# Patient Record
Sex: Female | Born: 1950 | Race: White | Hispanic: No | Marital: Married | Smoking: Never smoker
Health system: Southern US, Community
[De-identification: ages and names within clinical notes are randomized; demographics above are authoritative.]

## PROBLEM LIST (undated history)

## (undated) DIAGNOSIS — F0390 Unspecified dementia without behavioral disturbance: Secondary | ICD-10-CM

## (undated) DIAGNOSIS — I10 Essential (primary) hypertension: Secondary | ICD-10-CM

## (undated) HISTORY — PX: HYSTEROTOMY: SHX1776

---

## 2004-06-26 ENCOUNTER — Ambulatory Visit: Payer: Self-pay | Admitting: Family Medicine

## 2005-08-09 ENCOUNTER — Ambulatory Visit: Payer: Self-pay | Admitting: Family Medicine

## 2006-09-18 ENCOUNTER — Ambulatory Visit: Payer: Self-pay | Admitting: Family Medicine

## 2006-09-24 ENCOUNTER — Ambulatory Visit: Payer: Self-pay | Admitting: Family Medicine

## 2006-12-25 ENCOUNTER — Ambulatory Visit: Payer: Self-pay | Admitting: Unknown Physician Specialty

## 2007-09-18 ENCOUNTER — Ambulatory Visit: Payer: Self-pay | Admitting: Family Medicine

## 2007-10-06 ENCOUNTER — Ambulatory Visit: Payer: Self-pay | Admitting: Family Medicine

## 2008-02-16 ENCOUNTER — Ambulatory Visit: Payer: Self-pay | Admitting: Unknown Physician Specialty

## 2008-02-19 ENCOUNTER — Observation Stay: Payer: Self-pay | Admitting: Unknown Physician Specialty

## 2008-05-31 ENCOUNTER — Ambulatory Visit: Payer: Self-pay | Admitting: Unknown Physician Specialty

## 2008-06-03 ENCOUNTER — Inpatient Hospital Stay: Payer: Self-pay | Admitting: Unknown Physician Specialty

## 2008-06-22 ENCOUNTER — Ambulatory Visit: Payer: Self-pay | Admitting: Urology

## 2008-07-08 ENCOUNTER — Inpatient Hospital Stay: Payer: Self-pay | Admitting: Unknown Physician Specialty

## 2008-10-25 ENCOUNTER — Ambulatory Visit: Payer: Self-pay | Admitting: Family Medicine

## 2009-01-12 ENCOUNTER — Ambulatory Visit: Payer: Self-pay | Admitting: Family Medicine

## 2009-09-14 ENCOUNTER — Ambulatory Visit: Payer: Self-pay | Admitting: Unknown Physician Specialty

## 2009-10-15 ENCOUNTER — Emergency Department: Payer: Self-pay | Admitting: Emergency Medicine

## 2009-10-26 ENCOUNTER — Ambulatory Visit: Payer: Self-pay | Admitting: Family Medicine

## 2010-02-27 ENCOUNTER — Ambulatory Visit: Payer: Self-pay | Admitting: Unknown Physician Specialty

## 2011-01-10 ENCOUNTER — Ambulatory Visit: Payer: Self-pay | Admitting: Family Medicine

## 2011-02-14 ENCOUNTER — Emergency Department: Payer: Self-pay | Admitting: Unknown Physician Specialty

## 2012-02-07 ENCOUNTER — Ambulatory Visit: Payer: Self-pay | Admitting: Family Medicine

## 2012-02-18 ENCOUNTER — Ambulatory Visit: Payer: Self-pay | Admitting: Family Medicine

## 2012-08-18 ENCOUNTER — Ambulatory Visit: Payer: Self-pay | Admitting: Family Medicine

## 2015-06-07 DIAGNOSIS — N3941 Urge incontinence: Secondary | ICD-10-CM | POA: Diagnosis not present

## 2015-07-20 DIAGNOSIS — Z23 Encounter for immunization: Secondary | ICD-10-CM | POA: Diagnosis not present

## 2015-07-20 DIAGNOSIS — G301 Alzheimer's disease with late onset: Secondary | ICD-10-CM | POA: Diagnosis not present

## 2015-07-20 DIAGNOSIS — I10 Essential (primary) hypertension: Secondary | ICD-10-CM | POA: Diagnosis not present

## 2015-07-20 DIAGNOSIS — E785 Hyperlipidemia, unspecified: Secondary | ICD-10-CM | POA: Diagnosis not present

## 2015-07-20 DIAGNOSIS — F0281 Dementia in other diseases classified elsewhere with behavioral disturbance: Secondary | ICD-10-CM | POA: Diagnosis not present

## 2015-08-03 DIAGNOSIS — H43813 Vitreous degeneration, bilateral: Secondary | ICD-10-CM | POA: Diagnosis not present

## 2015-08-03 DIAGNOSIS — H35033 Hypertensive retinopathy, bilateral: Secondary | ICD-10-CM | POA: Diagnosis not present

## 2015-08-03 DIAGNOSIS — H2510 Age-related nuclear cataract, unspecified eye: Secondary | ICD-10-CM | POA: Diagnosis not present

## 2015-08-24 DIAGNOSIS — F0281 Dementia in other diseases classified elsewhere with behavioral disturbance: Secondary | ICD-10-CM | POA: Diagnosis not present

## 2015-08-24 DIAGNOSIS — G301 Alzheimer's disease with late onset: Secondary | ICD-10-CM | POA: Diagnosis not present

## 2015-10-21 ENCOUNTER — Emergency Department
Admission: EM | Admit: 2015-10-21 | Discharge: 2015-10-21 | Disposition: A | Discharge status: Home / Self Care | Payer: PPO | Attending: Emergency Medicine | Admitting: Emergency Medicine

## 2015-10-21 ENCOUNTER — Encounter: Payer: Self-pay | Admitting: Emergency Medicine

## 2015-10-21 DIAGNOSIS — I10 Essential (primary) hypertension: Secondary | ICD-10-CM | POA: Diagnosis not present

## 2015-10-21 DIAGNOSIS — R55 Syncope and collapse: Secondary | ICD-10-CM | POA: Diagnosis not present

## 2015-10-21 DIAGNOSIS — Z79899 Other long term (current) drug therapy: Secondary | ICD-10-CM | POA: Diagnosis not present

## 2015-10-21 HISTORY — DX: Unspecified dementia, unspecified severity, without behavioral disturbance, psychotic disturbance, mood disturbance, and anxiety: F03.90

## 2015-10-21 HISTORY — DX: Essential (primary) hypertension: I10

## 2015-10-21 LAB — COMPREHENSIVE METABOLIC PANEL
ALBUMIN: 4.2 g/dL (ref 3.5–5.0)
ALT: 25 U/L (ref 14–54)
AST: 31 U/L (ref 15–41)
Alkaline Phosphatase: 77 U/L (ref 38–126)
Anion gap: 9 (ref 5–15)
BUN: 27 mg/dL — AB (ref 6–20)
CHLORIDE: 106 mmol/L (ref 101–111)
CO2: 27 mmol/L (ref 22–32)
CREATININE: 1.07 mg/dL — AB (ref 0.44–1.00)
Calcium: 9.8 mg/dL (ref 8.9–10.3)
GFR calc Af Amer: >60 mL/min (ref >60)
GFR calc non Af Amer: 53 mL/min — ABNORMAL LOW (ref >60)
GLUCOSE: 110 mg/dL — AB (ref 65–99)
POTASSIUM: 4.2 mmol/L (ref 3.5–5.1)
SODIUM: 142 mmol/L (ref 135–145)
Total Bilirubin: 0.8 mg/dL (ref 0.3–1.2)
Total Protein: 7.5 g/dL (ref 6.5–8.1)

## 2015-10-21 LAB — CBC WITH DIFFERENTIAL/PLATELET
BASOS ABS: 0.1 10*3/uL (ref 0–0.1)
BASOS PCT: 1 %
EOS ABS: 0.1 10*3/uL (ref 0–0.7)
EOS PCT: 1 %
HCT: 43.5 % (ref 35.0–47.0)
Hemoglobin: 14.9 g/dL (ref 12.0–16.0)
LYMPHS PCT: 25 %
Lymphs Abs: 2.3 10*3/uL (ref 1.0–3.6)
MCH: 31.6 pg (ref 26.0–34.0)
MCHC: 34.2 g/dL (ref 32.0–36.0)
MCV: 92.5 fL (ref 80.0–100.0)
Monocytes Absolute: 0.6 10*3/uL (ref 0.2–0.9)
Monocytes Relative: 6 %
Neutro Abs: 6.3 10*3/uL (ref 1.4–6.5)
Neutrophils Relative %: 67 %
PLATELETS: 185 10*3/uL (ref 150–440)
RBC: 4.7 MIL/uL (ref 3.80–5.20)
RDW: 14 % (ref 11.5–14.5)
WBC: 9.4 10*3/uL (ref 3.6–11.0)

## 2015-10-21 LAB — TROPONIN I: Troponin I: <0.03 ng/mL (ref <0.03)

## 2015-10-21 MED ORDER — SODIUM CHLORIDE 0.9 % IV SOLN
Freq: Once | INTRAVENOUS | Status: AC
  Administered 2015-10-21: 1000 mL via INTRAVENOUS

## 2015-10-21 NOTE — ED Notes (Signed)
Pt. Husband Verbalizes understanding of d/c instructions, and follow-up. VS stable.  Pt. In NAD at time of d/c and family denies concerns. Pt. Ambulatory Out of the unit with steady gait and husband with her.

## 2015-10-21 NOTE — ED Notes (Signed)
[facility]  Husband states while they were in the waiting area she had a near syncopal episode.  Pt is alert, hx of demenia

## 2015-10-21 NOTE — ED Notes (Signed)
Husband at bedside.  

## 2015-10-21 NOTE — ED Provider Notes (Signed)
Time Seen: Approximately 1004  I have reviewed the triage notes  Chief Complaint: Near Syncope   History of Present Illness: Kelsey Mitchell is a 65 y.o. female who presents with a near syncope due to syncopal episode. Patient was coming to the local outpatient clinic for routine evaluation when she was brought into the waiting area from outside she collapsed into her husband's arms for what sounds like a very brief period of time. He states that he didn't see her completely pass out. Patient has dementia and history and review of systems are mainly taken through her husband who is her primary caretaker. The patient herself denies any physical complaints. Her husband states she's had no complaints of chest pain, shortness of breath, abnormal bowel movements, abnormal urination, etc. His main concern is that she doesn't eat or drink as much as she used to do to likely her dementia and he felt that she was dehydrated. The patient has continued to take her routine blood pressure medication and has had her medication this morning.   Past Medical History  Diagnosis Date  . Hypertension   . Dementia     There are no active problems to display for this patient.   No past surgical history on file.  No past surgical history on file.  No current outpatient prescriptions on file.  Allergies:  Review of patient's allergies indicates no known allergies.  Family History: No family history on file.  Social History: Social History  Substance Use Topics  . Smoking status: Never Smoker   . Smokeless tobacco: None  . Alcohol Use: None     Review of Systems:   10 point review of systems was performed and was otherwise negative:  Constitutional: No fever Eyes: No visual disturbances ENT: No sore throat, ear pain Cardiac: No chest pain Respiratory: No shortness of breath, wheezing, or stridor Abdomen: No abdominal pain, no vomiting, No diarrhea Endocrine: No weight loss, No night  sweats Extremities: No peripheral edema, cyanosis Skin: No rashes, easy bruising Neurologic: No focal weakness, trouble with speech or swollowing Urologic: No dysuria, Hematuria, or urinary frequency   Physical Exam:  ED Triage Vitals  Enc Vitals Group     BP 10/21/15 0935 83/55 mmHg     Pulse Rate 10/21/15 0935 64     Resp 10/21/15 0935 18     Temp 10/21/15 0935 98 F (36.7 C)     Temp Source 10/21/15 0935 Oral     SpO2 10/21/15 0935 99 %     Weight 10/21/15 0935 140 lb (63.504 kg)     Height 10/21/15 0935  (1.676 m)     Head Cir --      Peak Flow --      Pain Score --      Pain Loc --      Pain Edu? --      Excl. in GC? --     General: Awake , Alert , and Oriented times 1. Patient's cooperative Head: Normal cephalic , atraumatic Eyes: Pupils equal , round, reactive to light Nose/Throat: No nasal drainage, patent upper airway without erythema or exudate.  Neck: Supple, Full range of motion, No anterior adenopathy or palpable thyroid masses Lungs: Clear to ascultation without wheezes , rhonchi, or rales Heart: Regular rate, regular rhythm without murmurs , gallops , or rubs Abdomen: Soft, non tender without rebound, guarding , or rigidity; bowel sounds positive and symmetric in all 4 quadrants. No organomegaly .  Extremities: 2 plus symmetric pulses. No edema, clubbing or cyanosis Neurologic: normal ambulation, Motor symmetric without deficits, sensory intact Skin: warm, dry, no rashes   Labs:   All laboratory work was reviewed including any pertinent negatives or positives listed below:  Labs Reviewed  CBC WITH DIFFERENTIAL/PLATELET  COMPREHENSIVE METABOLIC PANEL  TROPONIN I    EKG:  ED ECG REPORT I, Jennye MoccasinBrian S Quigley, the attending physician, personally viewed and interpreted this ECG.  Date: 10/21/2015 EKG Time: 0958 Rate: 63 Rhythm: normal sinus rhythm QRS Axis: normal Intervals: normal ST/T Wave abnormalities: Nonspecific T wave abnormality  Conduction Disturbances: none Narrative Interpretation: unremarkable No acute ischemic changes    ED Course:  Patient is hemodynamically stable and was watched on a cardiac monitor and had no episodes of arrhythmia. Her creatinine is slightly elevated with an elevated BUN/creatinine ratio and I felt she may be dehydrated. Patient was given IV fluids and was up and ambulatory. Her husband states she is at her normal baseline mental status. Patient will be discharged home and advised to follow-up in the clinic. Encourage food and fluids when possible.    Assessment:  Near syncope Dehydration      Plan:  Outpatient Patient was advised to return immediately if condition worsens. Patient was advised to follow up with their primary care physician or other specialized physicians involved in their outpatient care. The patient and/or family member/power of attorney had laboratory results reviewed at the bedside. All questions and concerns were addressed and appropriate discharge instructions were distributed by the nursing staff.            Jennye MoccasinBrian S Quigley, MD 10/21/15 [phone]

## 2015-10-21 NOTE — ED Notes (Signed)
Husband reports pt has not been eating or drinking as normal for the past couple of mths. Pt with NAD noted. Pt does have dementia.

## 2015-10-21 NOTE — Discharge Instructions (Signed)
Syncope °Syncope is a medical term for fainting or passing out. This means you lose consciousness and drop to the ground. People are generally unconscious for less than 5 minutes. You may have some muscle twitches for up to 15 seconds before waking up and returning to normal. Syncope occurs more often in older adults, but it can happen to anyone. While most causes of syncope are not dangerous, syncope can be a sign of a serious medical problem. It is important to seek medical care.  °CAUSES  °Syncope is caused by a sudden drop in blood flow to the brain. The specific cause is often not determined. Factors that can bring on syncope include: °· Taking medicines that lower blood pressure. °· Sudden changes in posture, such as standing up quickly. °· Taking more medicine than prescribed. °· Standing in one place for too long. °· Seizure disorders. °· Dehydration and excessive exposure to heat. °· Low blood sugar (hypoglycemia). °· Straining to have a bowel movement. °· Heart disease, irregular heartbeat, or other circulatory problems. °· Fear, emotional distress, seeing blood, or severe pain. °SYMPTOMS  °Right before fainting, you may: °· Feel dizzy or light-headed. °· Feel nauseous. °· See all white or all black in your field of vision. °· Have cold, clammy skin. °DIAGNOSIS  °Your health care provider will ask about your symptoms, perform a physical exam, and perform an electrocardiogram (ECG) to record the electrical activity of your heart. Your health care provider may also perform other heart or blood tests to determine the cause of your syncope which may include: °· Transthoracic echocardiogram (TTE). During echocardiography, sound waves are used to evaluate how blood flows through your heart. °· Transesophageal echocardiogram (TEE). °· Cardiac monitoring. This allows your health care provider to monitor your heart rate and rhythm in real time. °· Holter monitor. This is a portable device that records your  heartbeat and can help diagnose heart arrhythmias. It allows your health care provider to track your heart activity for several days, if needed. °· Stress tests by exercise or by giving medicine that makes the heart beat faster. °TREATMENT  °In most cases, no treatment is needed. Depending on the cause of your syncope, your health care provider may recommend changing or stopping some of your medicines. °HOME CARE INSTRUCTIONS °· Have someone stay with you until you feel stable. °· Do not drive, use machinery, or play sports until your health care provider says it is okay. °· Keep all follow-up appointments as directed by your health care provider. °· Lie down right away if you start feeling like you might faint. Breathe deeply and steadily. Wait until all the symptoms have passed. °· Drink enough fluids to keep your urine clear or pale yellow. °· If you are taking blood pressure or heart medicine, get up slowly and take several minutes to sit and then stand. This can reduce dizziness. °SEEK IMMEDIATE MEDICAL CARE IF:  °· You have a severe headache. °· You have unusual pain in the chest, abdomen, or back. °· You are bleeding from your mouth or rectum, or you have black or tarry stool. °· You have an irregular or very fast heartbeat. °· You have pain with breathing. °· You have repeated fainting or seizure-like jerking during an episode. °· You faint when sitting or lying down. °· You have confusion. °· You have trouble walking. °· You have severe weakness. °· You have vision problems. °If you fainted, call your local emergency services (911 in [place] Do not drive   yourself to the hospital.  °  °This information is not intended to replace advice given to you by your health care provider. Make sure you discuss any questions you have with your health care provider. °  °Document Released: 03/19/2005 Document Revised: 08/03/2014 Document Reviewed: 05/18/2011 °Elsevier Interactive Patient Education ©2016 [employer]  [employer] ° °Near-Syncope °Near-syncope (commonly known as near fainting) is sudden weakness, dizziness, or feeling like you might pass out. During an episode of near-syncope, you may also develop pale skin, have tunnel vision, or feel sick to your stomach (nauseous). Near-syncope may occur when getting up after sitting or while standing for a long time. It is caused by a sudden decrease in blood flow to the brain. This decrease can result from various causes or triggers, most of which are not serious. However, because near-syncope can sometimes be a sign of something serious, a medical evaluation is required. The specific cause is often not determined. °HOME CARE INSTRUCTIONS  °Monitor your condition for any changes. The following actions may help to alleviate any discomfort you are experiencing: °· Have someone stay with you until you feel stable. °· Lie down right away and prop your feet up if you start feeling like you might faint. Breathe deeply and steadily. Wait until all the symptoms have passed. Most of these episodes last only a few minutes. You may feel tired for several hours.   °· Drink enough fluids to keep your urine clear or pale yellow.   °· If you are taking blood pressure or heart medicine, get up slowly when seated or lying down. Take several minutes to sit and then stand. This can reduce dizziness. °· Follow up with your health care provider as directed.  °SEEK IMMEDIATE MEDICAL CARE IF:  °· You have a severe headache.   °· You have unusual pain in the chest, abdomen, or back.   °· You are bleeding from the mouth or rectum, or you have black or tarry stool.   °· You have an irregular or very fast heartbeat.   °· You have repeated fainting or have seizure-like jerking during an episode.   °· You faint when sitting or lying down.   °· You have confusion.   °· You have difficulty walking.   °· You have severe weakness.   °· You have vision problems.   °MAKE SURE YOU:  °· Understand these  instructions. °· Will watch your condition. °· Will get help right away if you are not doing well or get worse. °  °This information is not intended to replace advice given to you by your health care provider. Make sure you discuss any questions you have with your health care provider. °  °Document Released: 03/19/2005 Document Revised: 03/24/2013 Document Reviewed: 08/22/2012 °Elsevier Interactive Patient Education ©2016 Elsevier Inc. ° °

## 2015-10-21 NOTE — ED Notes (Addendum)
Pt given crackers, peanut butter, and water per request with MD permission.  Pt tolerating food and water.

## 2015-11-23 DIAGNOSIS — Z23 Encounter for immunization: Secondary | ICD-10-CM | POA: Diagnosis not present

## 2015-11-23 DIAGNOSIS — I10 Essential (primary) hypertension: Secondary | ICD-10-CM | POA: Diagnosis not present

## 2015-11-23 DIAGNOSIS — E785 Hyperlipidemia, unspecified: Secondary | ICD-10-CM | POA: Diagnosis not present

## 2015-11-23 DIAGNOSIS — Z780 Asymptomatic menopausal state: Secondary | ICD-10-CM | POA: Diagnosis not present

## 2015-11-23 DIAGNOSIS — G301 Alzheimer's disease with late onset: Secondary | ICD-10-CM | POA: Diagnosis not present

## 2015-11-23 DIAGNOSIS — Z1231 Encounter for screening mammogram for malignant neoplasm of breast: Secondary | ICD-10-CM | POA: Diagnosis not present

## 2015-11-23 DIAGNOSIS — F0281 Dementia in other diseases classified elsewhere with behavioral disturbance: Secondary | ICD-10-CM | POA: Diagnosis not present

## 2016-02-29 DIAGNOSIS — E785 Hyperlipidemia, unspecified: Secondary | ICD-10-CM | POA: Diagnosis not present

## 2016-02-29 DIAGNOSIS — E876 Hypokalemia: Secondary | ICD-10-CM | POA: Diagnosis not present

## 2016-02-29 DIAGNOSIS — R350 Frequency of micturition: Secondary | ICD-10-CM | POA: Diagnosis not present

## 2016-02-29 DIAGNOSIS — I10 Essential (primary) hypertension: Secondary | ICD-10-CM | POA: Diagnosis not present

## 2016-02-29 DIAGNOSIS — G2581 Restless legs syndrome: Secondary | ICD-10-CM | POA: Diagnosis not present

## 2016-02-29 DIAGNOSIS — Z23 Encounter for immunization: Secondary | ICD-10-CM | POA: Diagnosis not present

## 2016-03-07 DIAGNOSIS — G301 Alzheimer's disease with late onset: Secondary | ICD-10-CM | POA: Diagnosis not present

## 2016-03-07 DIAGNOSIS — F0281 Dementia in other diseases classified elsewhere with behavioral disturbance: Secondary | ICD-10-CM | POA: Diagnosis not present

## 2016-05-11 DIAGNOSIS — N3941 Urge incontinence: Secondary | ICD-10-CM | POA: Diagnosis not present

## 2016-05-31 DIAGNOSIS — Z780 Asymptomatic menopausal state: Secondary | ICD-10-CM | POA: Diagnosis not present

## 2016-05-31 DIAGNOSIS — Z1231 Encounter for screening mammogram for malignant neoplasm of breast: Secondary | ICD-10-CM | POA: Diagnosis not present

## 2016-05-31 DIAGNOSIS — I10 Essential (primary) hypertension: Secondary | ICD-10-CM | POA: Diagnosis not present

## 2016-05-31 DIAGNOSIS — E785 Hyperlipidemia, unspecified: Secondary | ICD-10-CM | POA: Diagnosis not present

## 2016-09-06 DIAGNOSIS — I10 Essential (primary) hypertension: Secondary | ICD-10-CM | POA: Diagnosis not present

## 2016-09-06 DIAGNOSIS — G301 Alzheimer's disease with late onset: Secondary | ICD-10-CM | POA: Diagnosis not present

## 2016-09-06 DIAGNOSIS — F0281 Dementia in other diseases classified elsewhere with behavioral disturbance: Secondary | ICD-10-CM | POA: Diagnosis not present

## 2016-09-06 DIAGNOSIS — E876 Hypokalemia: Secondary | ICD-10-CM | POA: Diagnosis not present

## 2016-12-25 DIAGNOSIS — I10 Essential (primary) hypertension: Secondary | ICD-10-CM | POA: Diagnosis not present

## 2016-12-25 DIAGNOSIS — G301 Alzheimer's disease with late onset: Secondary | ICD-10-CM | POA: Diagnosis not present

## 2016-12-25 DIAGNOSIS — Z23 Encounter for immunization: Secondary | ICD-10-CM | POA: Diagnosis not present

## 2016-12-25 DIAGNOSIS — F0281 Dementia in other diseases classified elsewhere with behavioral disturbance: Secondary | ICD-10-CM | POA: Diagnosis not present

## 2017-04-24 DIAGNOSIS — E7800 Pure hypercholesterolemia, unspecified: Secondary | ICD-10-CM | POA: Diagnosis not present

## 2017-04-24 DIAGNOSIS — I10 Essential (primary) hypertension: Secondary | ICD-10-CM | POA: Diagnosis not present

## 2017-04-24 DIAGNOSIS — E876 Hypokalemia: Secondary | ICD-10-CM | POA: Diagnosis not present

## 2017-04-24 DIAGNOSIS — F0281 Dementia in other diseases classified elsewhere with behavioral disturbance: Secondary | ICD-10-CM | POA: Diagnosis not present

## 2017-04-24 DIAGNOSIS — F0280 Dementia in other diseases classified elsewhere without behavioral disturbance: Secondary | ICD-10-CM | POA: Diagnosis not present

## 2017-04-24 DIAGNOSIS — G301 Alzheimer's disease with late onset: Secondary | ICD-10-CM | POA: Diagnosis not present

## 2017-04-24 DIAGNOSIS — Z1231 Encounter for screening mammogram for malignant neoplasm of breast: Secondary | ICD-10-CM | POA: Diagnosis not present

## 2017-07-18 DIAGNOSIS — G301 Alzheimer's disease with late onset: Secondary | ICD-10-CM | POA: Diagnosis not present

## 2017-07-18 DIAGNOSIS — I10 Essential (primary) hypertension: Secondary | ICD-10-CM | POA: Diagnosis not present

## 2017-07-18 DIAGNOSIS — F0281 Dementia in other diseases classified elsewhere with behavioral disturbance: Secondary | ICD-10-CM | POA: Diagnosis not present

## 2017-07-18 DIAGNOSIS — E7800 Pure hypercholesterolemia, unspecified: Secondary | ICD-10-CM | POA: Diagnosis not present

## 2017-09-24 DIAGNOSIS — J9811 Atelectasis: Secondary | ICD-10-CM | POA: Diagnosis not present

## 2017-09-24 DIAGNOSIS — E785 Hyperlipidemia, unspecified: Secondary | ICD-10-CM | POA: Diagnosis not present

## 2017-09-24 DIAGNOSIS — E7800 Pure hypercholesterolemia, unspecified: Secondary | ICD-10-CM | POA: Diagnosis not present

## 2017-09-24 DIAGNOSIS — I10 Essential (primary) hypertension: Secondary | ICD-10-CM | POA: Diagnosis not present

## 2017-09-24 DIAGNOSIS — F0390 Unspecified dementia without behavioral disturbance: Secondary | ICD-10-CM | POA: Diagnosis not present

## 2017-09-24 DIAGNOSIS — R410 Disorientation, unspecified: Secondary | ICD-10-CM | POA: Diagnosis not present

## 2017-09-24 DIAGNOSIS — G2581 Restless legs syndrome: Secondary | ICD-10-CM | POA: Diagnosis not present

## 2017-09-24 DIAGNOSIS — R55 Syncope and collapse: Secondary | ICD-10-CM | POA: Diagnosis not present

## 2017-09-24 DIAGNOSIS — N390 Urinary tract infection, site not specified: Secondary | ICD-10-CM | POA: Diagnosis not present

## 2017-09-24 DIAGNOSIS — Z66 Do not resuscitate: Secondary | ICD-10-CM | POA: Diagnosis not present

## 2017-09-25 DIAGNOSIS — F0390 Unspecified dementia without behavioral disturbance: Secondary | ICD-10-CM | POA: Diagnosis not present

## 2017-09-25 DIAGNOSIS — I10 Essential (primary) hypertension: Secondary | ICD-10-CM | POA: Diagnosis not present

## 2017-09-25 DIAGNOSIS — R55 Syncope and collapse: Secondary | ICD-10-CM | POA: Diagnosis not present

## 2017-09-25 DIAGNOSIS — I340 Nonrheumatic mitral (valve) insufficiency: Secondary | ICD-10-CM | POA: Diagnosis not present

## 2017-09-25 DIAGNOSIS — N390 Urinary tract infection, site not specified: Secondary | ICD-10-CM | POA: Diagnosis not present

## 2017-09-26 DIAGNOSIS — I10 Essential (primary) hypertension: Secondary | ICD-10-CM | POA: Diagnosis not present

## 2017-09-26 DIAGNOSIS — N390 Urinary tract infection, site not specified: Secondary | ICD-10-CM | POA: Diagnosis not present

## 2017-09-26 DIAGNOSIS — F0390 Unspecified dementia without behavioral disturbance: Secondary | ICD-10-CM | POA: Diagnosis not present

## 2017-09-26 DIAGNOSIS — R55 Syncope and collapse: Secondary | ICD-10-CM | POA: Diagnosis not present

## 2017-10-02 ENCOUNTER — Other Ambulatory Visit: Payer: Self-pay

## 2017-10-02 NOTE — Patient Outreach (Signed)
[facility] Care Management  10/02/2017  Kelsey Mitchell October 18, 1950 [id number]  Transition of care  Referral date: 10/02/17 Referral source: [facility] Insurance: [employer] advantage  Telephone call to patient regarding transition of care referral. Contact answering phone states he is patients husband, Kelsey Mitchell.  Spouse states patient has had dementia for approximately 3 years.  HIPAA verified for patient by spouse. Explained reason for call. Spouse states patient was in the hospital due to a urinary tract infection. Spouse states patient has completed her course of antibiotics and is not have any additional symptoms or complaints. Spouse states patient has a follow up appointment with her primary MD within the next 2 weeks. Spouse states patient has her medications and takes as prescribed. Spouse states he has to administer medications to patient. Spouse states he has a private pay care giver for patient. Denies patient having any home health.  Spouse states he does not know the name of patients medications. States he travels for work. States he is not at home to look at medication bottles and would not be able to review patients medications at this time.  RNCM reviewed signs/ symptoms of urinary tract infection with patient.  RNCM discussed and offered [facility] and ongoing transition of care follow up.  Spouse declined services.  RNCM advised patient to notify MD of any changes in condition prior to scheduled appointment. RNCM provided contact name and number: [phone] or main office number [phone] and 24 hour nurse advise line [phone] by mail.   RNCM verified patient aware of 911 services for urgent/ emergent needs.  PLAN: RNCM will close patient due to refusal of services.  RNCM will send patients primary MD closure notification  RNCM will send [facility] broch[facility]/ magnet as discussed.    George InaDavina Allure Greaser RN,BSN,CCM [facility] Telephonic  [phone]

## 2017-10-24 DIAGNOSIS — R399 Unspecified symptoms and signs involving the genitourinary system: Secondary | ICD-10-CM | POA: Diagnosis not present

## 2017-10-24 DIAGNOSIS — I10 Essential (primary) hypertension: Secondary | ICD-10-CM | POA: Diagnosis not present

## 2018-01-23 DIAGNOSIS — G301 Alzheimer's disease with late onset: Secondary | ICD-10-CM | POA: Diagnosis not present

## 2018-01-23 DIAGNOSIS — N3281 Overactive bladder: Secondary | ICD-10-CM | POA: Diagnosis not present

## 2018-01-23 DIAGNOSIS — F0281 Dementia in other diseases classified elsewhere with behavioral disturbance: Secondary | ICD-10-CM | POA: Diagnosis not present

## 2018-01-23 DIAGNOSIS — I10 Essential (primary) hypertension: Secondary | ICD-10-CM | POA: Diagnosis not present

## 2018-01-23 DIAGNOSIS — Z23 Encounter for immunization: Secondary | ICD-10-CM | POA: Diagnosis not present

## 2018-01-23 DIAGNOSIS — R7989 Other specified abnormal findings of blood chemistry: Secondary | ICD-10-CM | POA: Diagnosis not present

## 2018-02-18 DIAGNOSIS — N390 Urinary tract infection, site not specified: Secondary | ICD-10-CM | POA: Diagnosis not present

## 2018-02-18 DIAGNOSIS — R8290 Unspecified abnormal findings in urine: Secondary | ICD-10-CM | POA: Diagnosis not present

## 2018-04-30 DIAGNOSIS — G301 Alzheimer's disease with late onset: Secondary | ICD-10-CM | POA: Diagnosis not present

## 2018-04-30 DIAGNOSIS — F0281 Dementia in other diseases classified elsewhere with behavioral disturbance: Secondary | ICD-10-CM | POA: Diagnosis not present

## 2018-04-30 DIAGNOSIS — I10 Essential (primary) hypertension: Secondary | ICD-10-CM | POA: Diagnosis not present

## 2018-04-30 DIAGNOSIS — F0280 Dementia in other diseases classified elsewhere without behavioral disturbance: Secondary | ICD-10-CM | POA: Diagnosis not present

## 2018-08-01 DIAGNOSIS — N3000 Acute cystitis without hematuria: Secondary | ICD-10-CM | POA: Diagnosis not present

## 2018-08-20 DIAGNOSIS — I10 Essential (primary) hypertension: Secondary | ICD-10-CM | POA: Diagnosis not present

## 2018-11-20 DIAGNOSIS — N3281 Overactive bladder: Secondary | ICD-10-CM | POA: Diagnosis not present

## 2018-11-20 DIAGNOSIS — G301 Alzheimer's disease with late onset: Secondary | ICD-10-CM | POA: Diagnosis not present

## 2018-11-20 DIAGNOSIS — Z23 Encounter for immunization: Secondary | ICD-10-CM | POA: Diagnosis not present

## 2018-11-20 DIAGNOSIS — F0281 Dementia in other diseases classified elsewhere with behavioral disturbance: Secondary | ICD-10-CM | POA: Diagnosis not present

## 2018-11-20 DIAGNOSIS — Z1239 Encounter for other screening for malignant neoplasm of breast: Secondary | ICD-10-CM | POA: Diagnosis not present

## 2018-11-20 DIAGNOSIS — I10 Essential (primary) hypertension: Secondary | ICD-10-CM | POA: Diagnosis not present

## 2019-02-25 DIAGNOSIS — T492X1A Poisoning by local astringents and local detergents, accidental (unintentional), initial encounter: Secondary | ICD-10-CM | POA: Diagnosis not present

## 2019-02-25 DIAGNOSIS — E785 Hyperlipidemia, unspecified: Secondary | ICD-10-CM | POA: Diagnosis not present

## 2019-02-25 DIAGNOSIS — J690 Pneumonitis due to inhalation of food and vomit: Secondary | ICD-10-CM | POA: Diagnosis not present

## 2019-02-25 DIAGNOSIS — I10 Essential (primary) hypertension: Secondary | ICD-10-CM | POA: Diagnosis not present

## 2019-02-25 DIAGNOSIS — T551X1A Toxic effect of detergents, accidental (unintentional), initial encounter: Secondary | ICD-10-CM | POA: Diagnosis not present

## 2019-02-25 DIAGNOSIS — F0390 Unspecified dementia without behavioral disturbance: Secondary | ICD-10-CM | POA: Diagnosis not present

## 2019-02-27 DIAGNOSIS — E785 Hyperlipidemia, unspecified: Secondary | ICD-10-CM | POA: Diagnosis not present

## 2019-02-27 DIAGNOSIS — F0390 Unspecified dementia without behavioral disturbance: Secondary | ICD-10-CM | POA: Diagnosis not present

## 2019-02-27 DIAGNOSIS — J690 Pneumonitis due to inhalation of food and vomit: Secondary | ICD-10-CM | POA: Diagnosis not present

## 2019-02-27 DIAGNOSIS — R918 Other nonspecific abnormal finding of lung field: Secondary | ICD-10-CM | POA: Diagnosis not present

## 2019-02-27 DIAGNOSIS — I10 Essential (primary) hypertension: Secondary | ICD-10-CM | POA: Diagnosis not present

## 2019-03-04 ENCOUNTER — Emergency Department: Payer: PPO

## 2019-03-04 ENCOUNTER — Other Ambulatory Visit: Payer: Self-pay

## 2019-03-04 ENCOUNTER — Inpatient Hospital Stay
Admission: EM | Admit: 2019-03-04 | Discharge: 2019-03-11 | DRG: 177 | Disposition: A | Discharge status: Hospice | Payer: PPO | Attending: Internal Medicine | Admitting: Internal Medicine

## 2019-03-04 DIAGNOSIS — R32 Unspecified urinary incontinence: Secondary | ICD-10-CM | POA: Diagnosis not present

## 2019-03-04 DIAGNOSIS — J189 Pneumonia, unspecified organism: Secondary | ICD-10-CM | POA: Diagnosis not present

## 2019-03-04 DIAGNOSIS — Z515 Encounter for palliative care: Secondary | ICD-10-CM | POA: Diagnosis not present

## 2019-03-04 DIAGNOSIS — I10 Essential (primary) hypertension: Secondary | ICD-10-CM | POA: Diagnosis present

## 2019-03-04 DIAGNOSIS — J9601 Acute respiratory failure with hypoxia: Secondary | ICD-10-CM | POA: Diagnosis not present

## 2019-03-04 DIAGNOSIS — R1310 Dysphagia, unspecified: Secondary | ICD-10-CM | POA: Diagnosis present

## 2019-03-04 DIAGNOSIS — E870 Hyperosmolality and hypernatremia: Secondary | ICD-10-CM | POA: Diagnosis present

## 2019-03-04 DIAGNOSIS — R6520 Severe sepsis without septic shock: Secondary | ICD-10-CM | POA: Diagnosis not present

## 2019-03-04 DIAGNOSIS — A419 Sepsis, unspecified organism: Secondary | ICD-10-CM

## 2019-03-04 DIAGNOSIS — G2581 Restless legs syndrome: Secondary | ICD-10-CM | POA: Diagnosis not present

## 2019-03-04 DIAGNOSIS — R0602 Shortness of breath: Secondary | ICD-10-CM | POA: Diagnosis not present

## 2019-03-04 DIAGNOSIS — Z20828 Contact with and (suspected) exposure to other viral communicable diseases: Secondary | ICD-10-CM | POA: Diagnosis present

## 2019-03-04 DIAGNOSIS — R05 Cough: Secondary | ICD-10-CM | POA: Diagnosis not present

## 2019-03-04 DIAGNOSIS — Z66 Do not resuscitate: Secondary | ICD-10-CM | POA: Diagnosis present

## 2019-03-04 DIAGNOSIS — R4020 Unspecified coma: Secondary | ICD-10-CM | POA: Diagnosis not present

## 2019-03-04 DIAGNOSIS — F0390 Unspecified dementia without behavioral disturbance: Secondary | ICD-10-CM | POA: Diagnosis not present

## 2019-03-04 DIAGNOSIS — J690 Pneumonitis due to inhalation of food and vomit: Secondary | ICD-10-CM | POA: Diagnosis not present

## 2019-03-04 LAB — CBC WITH DIFFERENTIAL/PLATELET
Abs Immature Granulocytes: 0.11 10*3/uL — ABNORMAL HIGH (ref 0.00–0.07)
Basophils Absolute: 0 10*3/uL (ref 0.0–0.1)
Basophils Relative: 0 %
Eosinophils Absolute: 0 10*3/uL (ref 0.0–0.5)
Eosinophils Relative: 0 %
HCT: 52.3 % — ABNORMAL HIGH (ref 36.0–46.0)
Hemoglobin: 16.4 g/dL — ABNORMAL HIGH (ref 12.0–15.0)
Immature Granulocytes: 1 %
Lymphocytes Relative: 10 %
Lymphs Abs: 1.1 10*3/uL (ref 0.7–4.0)
MCH: 31 pg (ref 26.0–34.0)
MCHC: 31.4 g/dL (ref 30.0–36.0)
MCV: 98.9 fL (ref 80.0–100.0)
Monocytes Absolute: 0.5 10*3/uL (ref 0.1–1.0)
Monocytes Relative: 5 %
Neutro Abs: 9.1 10*3/uL — ABNORMAL HIGH (ref 1.7–7.7)
Neutrophils Relative %: 84 %
Platelets: 288 10*3/uL (ref 150–400)
RBC: 5.29 MIL/uL — ABNORMAL HIGH (ref 3.87–5.11)
RDW: 12.6 % (ref 11.5–15.5)
WBC: 10.9 10*3/uL — ABNORMAL HIGH (ref 4.0–10.5)
nRBC: 0 % (ref 0.0–0.2)

## 2019-03-04 LAB — COMPREHENSIVE METABOLIC PANEL
ALT: 35 U/L (ref 0–44)
AST: 37 U/L (ref 15–41)
Albumin: 4.3 g/dL (ref 3.5–5.0)
Alkaline Phosphatase: 84 U/L (ref 38–126)
Anion gap: 13 (ref 5–15)
BUN: 31 mg/dL — ABNORMAL HIGH (ref 8–23)
CO2: 29 mmol/L (ref 22–32)
Calcium: 9.9 mg/dL (ref 8.9–10.3)
Chloride: 110 mmol/L (ref 98–111)
Creatinine, Ser: 1.01 mg/dL — ABNORMAL HIGH (ref 0.44–1.00)
GFR calc Af Amer: >60 mL/min (ref >60)
GFR calc non Af Amer: 56 mL/min — ABNORMAL LOW (ref >60)
Glucose, Bld: 115 mg/dL — ABNORMAL HIGH (ref 70–99)
Potassium: 3.6 mmol/L (ref 3.5–5.1)
Sodium: 152 mmol/L — ABNORMAL HIGH (ref 135–145)
Total Bilirubin: 0.8 mg/dL (ref 0.3–1.2)
Total Protein: 9 g/dL — ABNORMAL HIGH (ref 6.5–8.1)

## 2019-03-04 LAB — PROCALCITONIN: Procalcitonin: <0.1 ng/mL

## 2019-03-04 LAB — GLUCOSE, CAPILLARY: Glucose-Capillary: 99 mg/dL (ref 70–99)

## 2019-03-04 LAB — LACTIC ACID, PLASMA
Lactic Acid, Venous: 2.1 mmol/L (ref 0.5–1.9)
Lactic Acid, Venous: 2.2 mmol/L (ref 0.5–1.9)
Lactic Acid, Venous: 2 mmol/L (ref 0.5–1.9)

## 2019-03-04 LAB — POC SARS CORONAVIRUS 2 AG: SARS Coronavirus 2 Ag: NEGATIVE

## 2019-03-04 LAB — MRSA PCR SCREENING: MRSA by PCR: NEGATIVE

## 2019-03-04 MED ORDER — DEXTROSE-NACL 5-0.45 % IV SOLN
INTRAVENOUS | Status: DC
  Administered 2019-03-04: 21:00:00 via INTRAVENOUS

## 2019-03-04 MED ORDER — ENOXAPARIN SODIUM 40 MG/0.4ML ~~LOC~~ SOLN
40.0000 mg | SUBCUTANEOUS | Status: DC
  Administered 2019-03-04 – 2019-03-08 (×5): 40 mg via SUBCUTANEOUS
  Filled 2019-03-04 (×5): qty 0.4

## 2019-03-04 MED ORDER — SODIUM CHLORIDE 0.9 % IV SOLN
3.0000 g | Freq: Four times a day (QID) | INTRAVENOUS | Status: DC
  Administered 2019-03-04 – 2019-03-07 (×10): 3 g via INTRAVENOUS
  Filled 2019-03-04 (×2): qty 8
  Filled 2019-03-04: qty 3
  Filled 2019-03-04 (×2): qty 8
  Filled 2019-03-04: qty 3
  Filled 2019-03-04: qty 8
  Filled 2019-03-04: qty 3
  Filled 2019-03-04: qty 8
  Filled 2019-03-04 (×2): qty 3
  Filled 2019-03-04 (×3): qty 8

## 2019-03-04 MED ORDER — PIPERACILLIN-TAZOBACTAM 3.375 G IVPB
3.3750 g | Freq: Once | INTRAVENOUS | Status: AC
  Administered 2019-03-04: 3.375 g via INTRAVENOUS
  Filled 2019-03-04: qty 50

## 2019-03-04 MED ORDER — SODIUM CHLORIDE 0.9 % IV BOLUS
500.0000 mL | Freq: Once | INTRAVENOUS | Status: AC
  Administered 2019-03-04: 500 mL via INTRAVENOUS

## 2019-03-04 MED ORDER — IPRATROPIUM-ALBUTEROL 0.5-2.5 (3) MG/3ML IN SOLN
3.0000 mL | Freq: Once | RESPIRATORY_TRACT | Status: AC
  Administered 2019-03-04: 3 mL via RESPIRATORY_TRACT
  Filled 2019-03-04: qty 3

## 2019-03-04 MED ORDER — SODIUM CHLORIDE 0.9% FLUSH
3.0000 mL | Freq: Two times a day (BID) | INTRAVENOUS | Status: DC
  Administered 2019-03-05 – 2019-03-10 (×8): 3 mL via INTRAVENOUS

## 2019-03-04 MED ORDER — ROPINIROLE HCL 1 MG PO TABS
3.0000 mg | ORAL_TABLET | Freq: Every day | ORAL | Status: DC
  Administered 2019-03-07 – 2019-03-08 (×2): 3 mg via ORAL
  Filled 2019-03-04 (×7): qty 3

## 2019-03-04 MED ORDER — SODIUM CHLORIDE 0.9 % IV SOLN
Freq: Once | INTRAVENOUS | Status: AC
  Administered 2019-03-04: 15:00:00 via INTRAVENOUS

## 2019-03-04 MED ORDER — SODIUM CHLORIDE 0.9 % IV SOLN
250.0000 mL | INTRAVENOUS | Status: DC | PRN
  Administered 2019-03-05 – 2019-03-07 (×4): 250 mL via INTRAVENOUS

## 2019-03-04 MED ORDER — SODIUM CHLORIDE 0.9% FLUSH: 3.0000 mL | INTRAVENOUS | Status: DC | PRN

## 2019-03-04 NOTE — H&P (Signed)
[facility] History and Physical   Patient: Kelsey Mitchell ZOX:[id number]   [place] [place] DOB: February 10, 1951   DOA: 03/04/2019   DOS: 03/04/2019   DOS: the patient was seen and examined on 03/04/2019  Patient coming from: The patient is coming from Home  Chief Complaint: Cough and shortness of breath with poor p.o. intake  HPI: Kelsey ParmaDonna Beddow is a 68 y.o. female with Past medical history of dementia, HTN, urinary incontinence, restless leg syndrome, HLD. Patient presented with complaints of cough and shortness of breath. Reportedly patient went [place] her beach [place] where she ate a bunch of deodorant gel balls.  She had some foaming from the mouth at that time and had some cough. She went to ED that day in 3 hours and with normal chest x-ray she was sent home. She went to ED again 2 days later due to persistent cough at which time they identified a possible pneumonia on the chest x-ray and patient was started on Augmentin. Patient continues to remain weak and fatigued and tired.  Has poor p.o. intake.  No nausea or vomiting.  Patient was also more confused than her baseline and therefore husband called the PCP and patient was informed that she should come to the ER for further evaluation. At the time of my evaluation patient denies any complaints of chest pain abdominal pain.  No nausea no vomiting. Patient does have difficulty swallowing at her baseline.  ED Course: Patient was initially hypoxic.  Chest x-ray shows possible pneumonia.  Patient was referred for admission.  At her baseline ambulates with assistance He is not independent for most of her ADL;  Does not manages her medication on her own.  Review of Systems: as mentioned in the history of present illness.  All other systems reviewed and are negative.  Past Medical History:  Diagnosis Date  . Dementia (HCC)   . Hypertension    History reviewed. No pertinent surgical history. Social History:  reports that  she has never smoked. She does not have any smokeless tobacco history on file. No history on file for alcohol and drug.  No Known Allergies  Family history reviewed and not pertinent   Prior to Admission medications   Medication Sig Start Date End Date Taking? Authorizing Provider  amoxicillin-clavulanate (AUGMENTIN) 875-125 MG tablet Take 1 tablet by mouth 2 (two) times daily. 02/27/19 03/09/19 Yes [provider]  KLOR-CON M20 20 MEQ tablet Take 20 mEq by mouth daily.    Yes [provider]  lisinopril (PRINIVIL,ZESTRIL) 10 MG tablet Take 1 tablet by mouth daily. 08/26/15  Yes [provider]  MYRBETRIQ 50 MG TB24 tablet Take 50 mg by mouth daily.    Yes [provider]  rOPINIRole (REQUIP) 3 MG tablet Take 3 mg by mouth at bedtime.   Yes [provider]  simvastatin (ZOCOR) 20 MG tablet Take 20 mg by mouth every evening.    Yes [provider]  traZODone (DESYREL) 50 MG tablet Take 50 mg by mouth daily as needed (prolonged vocalization).    Yes [provider]    Physical Exam: Vitals:   03/04/19 1730 03/04/19 1800 03/04/19 1830 03/04/19 1900  BP: 132/80 (!) 124/59 125/66 126/61  Pulse: 65 71 65 63  Resp: 20 (!) 23 (!) 21 19  Temp:      SpO2: 99% 97% 100% 100%  Weight:      Height:        General: alert and not oriented  to time, place, and person. Appear in moderate distress, affect anxious Eyes: PERRL, Conjunctiva normal ENT: Oral Mucosa Clear, moist  Neck: no JVD, no Abnormal Mass Or lumps Cardiovascular: S1 and S2 Present, no Murmur, peripheral pulses symmetrical Respiratory: increased respiratory effort, Bilateral Air entry equal and Decreased, no signs of accessory muscle use, bilateral upper airway crackles, no wheezes Abdomen: Bowel Sound present, Soft and no tenderness, no hernia Skin: no rashes  Extremities: no Pedal edema, no calf tenderness Neurologic: without any new focal findings poor attention, slow  response to questions with prolonged speech Gait not checked due to patient safety concerns  Data Reviewed: I have personally reviewed and interpreted labs, imaging as discussed below.  CBC: Recent Labs  Lab 03/04/19 1252  WBC 10.9*  NEUTROABS 9.1*  HGB 16.4*  HCT 52.3*  MCV 98.9  PLT 381   Basic Metabolic Panel: Recent Labs  Lab 03/04/19 1252  NA 152*  K 3.6  CL 110  CO2 29  GLUCOSE 115*  BUN 31*  CREATININE 1.01*  CALCIUM 9.9   GFR: Estimated Creatinine Clearance: 46.5 mL/min (A) (by C-G formula based on SCr of 1.01 mg/dL (H)). Liver Function Tests: Recent Labs  Lab 03/04/19 1252  AST 37  ALT 35  ALKPHOS 84  BILITOT 0.8  PROT 9.0*  ALBUMIN 4.3   No results for input(s): LIPASE, AMYLASE in the last 168 hours. No results for input(s): AMMONIA in the last 168 hours. Coagulation Profile: No results for input(s): INR, PROTIME in the last 168 hours. Cardiac Enzymes: No results for input(s): CKTOTAL, CKMB, CKMBINDEX, TROPONINI in the last 168 hours. BNP (last 3 results) No results for input(s): PROBNP in the last 8760 hours. HbA1C: No results for input(s): HGBA1C in the last 72 hours. CBG: No results for input(s): GLUCAP in the last 168 hours. Lipid Profile: No results for input(s): CHOL, HDL, LDLCALC, TRIG, CHOLHDL, LDLDIRECT in the last 72 hours. Thyroid Function Tests: No results for input(s): TSH, T4TOTAL, FREET4, T3FREE, THYROIDAB in the last 72 hours. Anemia Panel: No results for input(s): VITAMINB12, FOLATE, FERRITIN, TIBC, IRON, RETICCTPCT in the last 72 hours. Urine analysis: No results found for: COLORURINE, APPEARANCEUR, LABSPEC, [place], GLUCOSEU, HGBUR, BILIRUBINUR, KETONESUR, PROTEINUR, UROBILINOGEN, NITRITE, LEUKOCYTESUR  Radiological Exams on Admission: Dg Chest 2 View  Result Date: 03/04/2019 CLINICAL DATA:  Cough and shortness of breath. EXAM: CHEST - 2 VIEW COMPARISON:  Radiograph 08/18/2012 FINDINGS: Patchy left lower lobe opacity  suspicious for pneumonia. Streaky right infrahilar atelectasis. Overall lung volumes are low. Normal heart size and mediastinal contours. No pulmonary edema, pleural effusion, or pneumothorax. Colonic interposition under the left hemidiaphragm. Exaggerated thoracic kyphosis. Mild anterior wedging of vertebra at the thoracolumbar junction. IMPRESSION: 1. Patchy left lower lobe opacity suspicious for pneumonia. 2. Streaky right infrahilar atelectasis. 3. Anterior wedging of vertebra at the thoracolumbar junction, mild age indeterminate compression fracture, but new from 2014. Electronically Signed   By: Keith Rake M.D.   On: 03/04/2019 13:37    I reviewed all nursing notes, pharmacy notes, vitals, pertinent old records.  Assessment/Plan 1.  Aspiration pneumonia Speech therapy consulted. Patient remains n.p.o. for now. We will treat with IV Unasyn. Follow-up on cultures. Chest vest as the patient appears to have poor cough.  2.  Dementia. Restless leg syndrome. Continue home medication for now.  3.  Hypernatremia. From poor p.o. intake or Patient is on D5 half-normal saline. Recheck morning BMP every 4 hours.  4.  Urinary incontinence. Patient is on Myrbetriq. We  will resume once the patient is able to swallow safely.  Nutrition: NPO except medications in applesauce DVT Prophylaxis: Subcutaneous Heparin   Advance goals of care discussion: DNR discussed with the husband who is at bedside  Consults: none   Family Communication: family was present at bedside, at the time of interview.  Opportunity was given to ask question and all questions were answered satisfactorily.  Disposition: Admitted as inpatient, med-surge unit. Likely to be discharged home, in 2 days.  I have discussed plan of care as described above with RN and patient/family.   Author: Lynden Oxford, MD [facility] 03/04/2019 7:39 PM   To reach On-call, see care teams to locate the attending and reach  out to them via [email or web address]. If 7PM-7AM, please contact night-coverage If you still have difficulty reaching the attending provider, please page the [facility] (Director on Call) for [facility] on amion for assistance.

## 2019-03-04 NOTE — ED Notes (Signed)
Pt brief changed and repositioned in bed at this time

## 2019-03-04 NOTE — Consult Note (Signed)
CODE SEPSIS - PHARMACY COMMUNICATION  **Broad Spectrum Antibiotics should be administered within 1 hour of Sepsis diagnosis**  Time Code Sepsis Called/Page Received: 1328  Antibiotics Ordered: pip/tazo  Time of 1st antibiotic administration: [place] ,PharmD Clinical Pharmacist  03/04/2019  2:35 PM

## 2019-03-04 NOTE — Consult Note (Signed)
Pharmacy Antibiotic Note  Kelsey Mitchell is a 68 y.o. female admitted on 03/04/2019 with pneumonia.  Pharmacy has been consulted for Unasyn dosing.  Plan: Unasyn 3 g q6H.   Height: 5\' 3"  (160 cm) Weight: 139 lb 15.9 oz (63.5 kg) IBW/kg (Calculated) : 52.4  Temp (24hrs), Avg:98 F (36.7 C), Min:98 F (36.7 C), Max:98 F (36.7 C)  Recent Labs  Lab 03/04/19 1252 03/04/19 1529 03/04/19 1752  WBC 10.9*  --   --   CREATININE 1.01*  --   --   LATICACIDVEN 2.1* 2.2* 2.0*    Estimated Creatinine Clearance: 46.5 mL/min (A) (by C-G formula based on SCr of 1.01 mg/dL (H)).    No Known Allergies  Microbiology results: 12/2 BCx: pending 12/2 MRSA PCR: Negative  Thank you for allowing pharmacy to be a part of this patient's care.  Oswald Hillock 03/04/2019 7:01 PM

## 2019-03-04 NOTE — ED Notes (Signed)
Pt asleep in bed, vss

## 2019-03-04 NOTE — ED Notes (Signed)
Pt pulled out Left AC IV, bleeding controlled. No needs at this time

## 2019-03-04 NOTE — Consult Note (Signed)
PHARMACY -  BRIEF ANTIBIOTIC NOTE   Pharmacy has received consult(s) for PNA from an ED provider.  The patient's profile has been reviewed for ht/wt/allergies/indication/available labs.    One time order(s) placed for Pip/tazo  Further antibiotics/pharmacy consults should be ordered by admitting physician if indicated.                       Thank you, Oswald Hillock 03/04/2019  1:36 PM

## 2019-03-04 NOTE — ED Notes (Signed)
Full rainbow, lactic and blood culture sent to lab

## 2019-03-04 NOTE — ED Notes (Signed)
Yellow fall socks placed on pt, yellow fall arm band, bed alarm on,.

## 2019-03-04 NOTE — ED Notes (Signed)
Pt placed on 2L [place] at this time. 

## 2019-03-04 NOTE — ED Provider Notes (Addendum)
[facility] Emergency Department Provider Note  ____________________________________________   First MD Initiated Contact with Patient 03/04/19 1327     (approximate)  I have reviewed the triage vital signs and the nursing notes.   HISTORY  Chief Complaint possbile pneumonia    HPI Kelsey Mitchell is a 68 y.o. female with history of dementia, hypertension, here with cough and shortness of breath.  The patient had an episode last Wednesday while at the beach.  She reportedly has dementia and accidentally got into and was witnessed to be eating deodorant gel balls at her beach house.  She was foaming at the mouth at that time.  She was seen in the ED on Wednesday with normal chest x-ray and sent home.  She went back on Friday and was started on Augmentin for possible aspiration pneumonia.  Since then, she has had progressively worsening weakness, cough, shortness of breath, and decreased level of consciousness.  She essentially is no longer eating or drinking.  She has been unable to take the Augmentin due to difficulty swallowing.  She has been increasingly drowsy.  She has been less responsive.  Her husband called her PCP who told her to come to the ED for evaluation.  She denies any complaints on my assessment, the history is limited by dementia.  Level 5 caveat invoked as remainder of history, ROS, and physical exam limited due to patient's dementia.         Past Medical History:  Diagnosis Date  . Dementia (HCC)   . Hypertension     There are no active problems to display for this patient.   History reviewed. No pertinent surgical history.  Prior to Admission medications   Medication Sig Start Date End Date Taking? Authorizing Provider  donepezil (ARICEPT) 10 MG tablet Take 1 tablet by mouth every evening. 10/09/15   [provider]  KLOR-CON M20 20 MEQ tablet Take 1 tablet by mouth daily. 09/11/15   [provider]  lisinopril  (PRINIVIL,ZESTRIL) 10 MG tablet Take 1 tablet by mouth daily. 08/26/15   [provider]  MYRBETRIQ 50 MG TB24 tablet Take 1 tablet by mouth daily. 10/01/15   [provider]  simvastatin (ZOCOR) 20 MG tablet Take 1 tablet by mouth every evening. 09/03/15   [provider]  traZODone (DESYREL) 50 MG tablet Take 1 tablet by mouth at bedtime. 10/09/15   [provider]    Allergies Patient has no known allergies.  No family history on file.  Social History Social History   Tobacco Use  . Smoking status: Never Smoker  Substance Use Topics  . Alcohol use: Not on file  . Drug use: Not on file    Review of Systems  Review of Systems  Unable to perform ROS: Dementia     ____________________________________________  PHYSICAL EXAM:      VITAL SIGNS: ED Triage Vitals  Enc Vitals Group     BP 03/04/19 1242 135/87     Pulse Rate 03/04/19 1242 (!) 105     Resp 03/04/19 1242 19     Temp 03/04/19 1242 98 F (36.7 C)     Temp src --      SpO2 03/04/19 1242 (!) 86 %     Weight 03/04/19 1241 139 lb 15.9 oz (63.5 kg)     Height 03/04/19 1241  (1.6 m)     Head Circumference --      Peak Flow --  Pain Score 03/04/19 1241 0     Pain Loc --      Pain Edu? --      Excl. in GC? --      Physical Exam Vitals signs and nursing note reviewed.  Constitutional:      General: She is not in acute distress.    Appearance: She is well-developed.  HENT:     Head: Normocephalic and atraumatic.  Eyes:     Conjunctiva/sclera: Conjunctivae normal.  Neck:     Musculoskeletal: Neck supple.  Cardiovascular:     Rate and Rhythm: Normal rate and regular rhythm.     Heart sounds: Normal heart sounds.  Pulmonary:     Effort: Tachypnea and respiratory distress present.     Breath sounds: Rhonchi and rales present. No wheezing.     Comments: Marked transmitted upper airway sounds, with course BS. Abdominal:     General: There is no distension.  Skin:     General: Skin is warm.     Capillary Refill: Capillary refill takes less than 2 seconds.     Findings: No rash.  Neurological:     Mental Status: She is alert and oriented to person, place, and time.     Motor: No abnormal muscle tone.       ____________________________________________   LABS (all labs ordered are listed, but only abnormal results are displayed)  Labs Reviewed  LACTIC ACID, PLASMA - Abnormal; Notable for the following components:      Result Value   Lactic Acid, Venous 2.1 (*)    All other components within normal limits  COMPREHENSIVE METABOLIC PANEL - Abnormal; Notable for the following components:   Sodium 152 (*)    Glucose, Bld 115 (*)    BUN 31 (*)    Creatinine, Ser 1.01 (*)    Total Protein 9.0 (*)    GFR calc non Af Amer 56 (*)    All other components within normal limits  CBC WITH DIFFERENTIAL/PLATELET - Abnormal; Notable for the following components:   WBC 10.9 (*)    RBC 5.29 (*)    Hemoglobin 16.4 (*)    HCT 52.3 (*)    Neutro Abs 9.1 (*)    Abs Immature Granulocytes 0.11 (*)    All other components within normal limits  CULTURE, BLOOD (ROUTINE X 2)  CULTURE, BLOOD (ROUTINE X 2)  URINE CULTURE  MRSA PCR SCREENING  LACTIC ACID, PLASMA  URINALYSIS, COMPLETE (UACMP) WITH MICROSCOPIC  PROCALCITONIN  POC SARS CORONAVIRUS 2 AG -  ED    ____________________________________________  EKG: Normal sinus rhythm, VR 76. QRS 88, QTc 440. No acute St changes. No STEMI. ________________________________________  RADIOLOGY All imaging, including plain films, CT scans, and ultrasounds, independently reviewed by me, and interpretations confirmed via formal radiology reads.  ED MD interpretation:   CXR: Left basilar PNA  Official radiology report(s): Dg Chest 2 View  Result Date: 03/04/2019 CLINICAL DATA:  Cough and shortness of breath. EXAM: CHEST - 2 VIEW COMPARISON:  Radiograph 08/18/2012 FINDINGS: Patchy left lower lobe opacity suspicious for  pneumonia. Streaky right infrahilar atelectasis. Overall lung volumes are low. Normal heart size and mediastinal contours. No pulmonary edema, pleural effusion, or pneumothorax. Colonic interposition under the left hemidiaphragm. Exaggerated thoracic kyphosis. Mild anterior wedging of vertebra at the thoracolumbar junction. IMPRESSION: 1. Patchy left lower lobe opacity suspicious for pneumonia. 2. Streaky right infrahilar atelectasis. 3. Anterior wedging of vertebra at the thoracolumbar junction, mild age indeterminate compression fracture, but new  from 2014. Electronically Signed   By: Keith Rake M.D.   On: 03/04/2019 13:37    ____________________________________________  PROCEDURES   Procedure(s) performed (including Critical Care):  .Critical Care Performed by: Duffy Bruce, MD Authorized by: Duffy Bruce, MD   Critical care provider statement:    Critical care time (minutes):  35   Critical care time was exclusive of:  Separately billable procedures and treating other patients and teaching time   Critical care was necessary to treat or prevent imminent or life-threatening deterioration of the following conditions:  Circulatory failure, cardiac failure and sepsis   Critical care was time spent personally by me on the following activities:  Development of treatment plan with patient or surrogate, discussions with consultants, evaluation of patient's response to treatment, examination of patient, obtaining history from patient or surrogate, ordering and performing treatments and interventions, ordering and review of laboratory studies, ordering and review of radiographic studies, pulse oximetry, re-evaluation of patient's condition and review of old charts   I assumed direction of critical care for this patient from another provider in my specialty: no      ____________________________________________  INITIAL IMPRESSION / MDM / [place] / ED COURSE  As part of my  medical decision making, I reviewed the following data within the [place] notes reviewed and incorporated, Old chart reviewed, Notes from prior ED visits, and [place] Controlled Substance Database       *Kelsey Mitchell was evaluated in Emergency Department on 03/04/2019 for the symptoms described in the history of present illness. She was evaluated in the context of the global COVID-19 pandemic, which necessitated consideration that the patient might be at risk for infection with the SARS-CoV-2 virus that causes COVID-19. Institutional protocols and algorithms that pertain to the evaluation of patients at risk for COVID-19 are in a state of rapid change based on information released by regulatory bodies including the CDC and federal and state organizations. These policies and algorithms were followed during the patient's care in the ED.  Some ED evaluations and interventions may be delayed as a result of limited staffing during the pandemic.*  Clinical Course as of Mar 04 1543  Wed Mar 04, 2019  1325 Lymphocytes: 10 [KP]  110 68 yo F here with acute hypoxic resp failure 2/2 suspected aspiration PNA. Hypoxic but improved on 1L  on arrival. Tachycardic, confused from baseline. Will activate sepsis protocol, perform NT suctioning and start on broad-spectrum ABX for aspiration. Initial mild lactic elevation, leukocytosis is c/w early sepsis. No hypotension. CMP shows likely hypovolemic hypernatremia - IVF given but will be cautious given resp status.   [CI]  1426 No hypotension, LA <4, will be cautious and hold on 30 cc/kg fluids given age, tenuous resp status w/ bibasilar rales on exam.   [CI]    Clinical Course User Index [CI] Duffy Bruce, MD [KP] Harvest Dark, MD    Medical Decision Making:  As above.  ____________________________________________  FINAL CLINICAL IMPRESSION(S) / ED DIAGNOSES  Final diagnoses:  Aspiration pneumonia of left lower lobe,  unspecified aspiration pneumonia type ([place])  Sepsis due to pneumonia ([place])  Acute respiratory failure with hypoxia ([place])     MEDICATIONS GIVEN DURING THIS VISIT:  Medications  piperacillin-tazobactam (ZOSYN) IVPB 3.375 g (has no administration in time range)  ipratropium-albuterol (DUONEB) 0.5-2.5 (3) MG/3ML nebulizer solution 3 mL (has no administration in time range)  sodium chloride 0.9 % bolus 500 mL (has no administration in time range)  0.9 %  sodium chloride infusion (has no administration in time range)     ED Discharge Orders    None       Note:  This document was prepared using Dragon voice recognition software and may include unintentional dictation errors.   Shaune PollackIsaacs, Sophy Mesler, MD 03/04/19 1544    Shaune PollackIsaacs, Khaylee Mcevoy, MD 04/15/19 1318

## 2019-03-04 NOTE — ED Triage Notes (Signed)
Pt comes via POV from home with c/o possible pneumonia.  Family states pt recently started coughing pretty bad. Pt taken to PCP and was informed she had a touch of pneumonia. Pt was prescribed amoxicillin. Pt has dementia and per family it has been a little difficult to get her to take.   Pt has junky wet cough present. Family denies any contact with anyone with COVID and no recent test performed.  Pt's current O2-86%RA.  Pt also states limited oral intake.

## 2019-03-04 NOTE — ED Notes (Signed)
Patient's caregiver is Julaine Fusi. Will be one of her designated visitors.

## 2019-03-05 ENCOUNTER — Inpatient Hospital Stay: Payer: PPO

## 2019-03-05 LAB — CBC WITH DIFFERENTIAL/PLATELET
Abs Immature Granulocytes: 0.22 10*3/uL — ABNORMAL HIGH (ref 0.00–0.07)
Basophils Absolute: 0 10*3/uL (ref 0.0–0.1)
Basophils Relative: 0 %
Eosinophils Absolute: 0 10*3/uL (ref 0.0–0.5)
Eosinophils Relative: 0 %
HCT: 43.5 % (ref 36.0–46.0)
Hemoglobin: 13.6 g/dL (ref 12.0–15.0)
Immature Granulocytes: 2 %
Lymphocytes Relative: 8 %
Lymphs Abs: 1 10*3/uL (ref 0.7–4.0)
MCH: 30.9 pg (ref 26.0–34.0)
MCHC: 31.3 g/dL (ref 30.0–36.0)
MCV: 98.9 fL (ref 80.0–100.0)
Monocytes Absolute: 0.8 10*3/uL (ref 0.1–1.0)
Monocytes Relative: 6 %
Neutro Abs: 11 10*3/uL — ABNORMAL HIGH (ref 1.7–7.7)
Neutrophils Relative %: 84 %
Platelets: 194 10*3/uL (ref 150–400)
RBC: 4.4 MIL/uL (ref 3.87–5.11)
RDW: 12.5 % (ref 11.5–15.5)
WBC: 13.1 10*3/uL — ABNORMAL HIGH (ref 4.0–10.5)
nRBC: 0 % (ref 0.0–0.2)

## 2019-03-05 LAB — BASIC METABOLIC PANEL
Anion gap: 7 (ref 5–15)
Anion gap: 7 (ref 5–15)
Anion gap: 9 (ref 5–15)
Anion gap: 7 (ref 5–15)
Anion gap: 8 (ref 5–15)
Anion gap: 7 (ref 5–15)
BUN: 25 mg/dL — ABNORMAL HIGH (ref 8–23)
BUN: 21 mg/dL (ref 8–23)
BUN: 19 mg/dL (ref 8–23)
BUN: 17 mg/dL (ref 8–23)
BUN: 14 mg/dL (ref 8–23)
BUN: 13 mg/dL (ref 8–23)
CO2: 29 mmol/L (ref 22–32)
CO2: 29 mmol/L (ref 22–32)
CO2: 28 mmol/L (ref 22–32)
CO2: 29 mmol/L (ref 22–32)
CO2: 27 mmol/L (ref 22–32)
CO2: 27 mmol/L (ref 22–32)
Calcium: 8.4 mg/dL — ABNORMAL LOW (ref 8.9–10.3)
Calcium: 8.2 mg/dL — ABNORMAL LOW (ref 8.9–10.3)
Calcium: 8.4 mg/dL — ABNORMAL LOW (ref 8.9–10.3)
Calcium: 8.8 mg/dL — ABNORMAL LOW (ref 8.9–10.3)
Calcium: 8.4 mg/dL — ABNORMAL LOW (ref 8.9–10.3)
Calcium: 8.3 mg/dL — ABNORMAL LOW (ref 8.9–10.3)
Chloride: 115 mmol/L — ABNORMAL HIGH (ref 98–111)
Chloride: 115 mmol/L — ABNORMAL HIGH (ref 98–111)
Chloride: 115 mmol/L — ABNORMAL HIGH (ref 98–111)
Chloride: 114 mmol/L — ABNORMAL HIGH (ref 98–111)
Chloride: 113 mmol/L — ABNORMAL HIGH (ref 98–111)
Chloride: 115 mmol/L — ABNORMAL HIGH (ref 98–111)
Creatinine, Ser: 0.83 mg/dL (ref 0.44–1.00)
Creatinine, Ser: 0.8 mg/dL (ref 0.44–1.00)
Creatinine, Ser: 0.78 mg/dL (ref 0.44–1.00)
Creatinine, Ser: 0.73 mg/dL (ref 0.44–1.00)
Creatinine, Ser: 0.77 mg/dL (ref 0.44–1.00)
Creatinine, Ser: 0.54 mg/dL (ref 0.44–1.00)
GFR calc Af Amer: >60 mL/min (ref >60)
GFR calc Af Amer: >60 mL/min (ref >60)
GFR calc Af Amer: >60 mL/min (ref >60)
GFR calc Af Amer: >60 mL/min (ref >60)
GFR calc Af Amer: >60 mL/min (ref >60)
GFR calc Af Amer: >60 mL/min (ref >60)
GFR calc non Af Amer: >60 mL/min (ref >60)
GFR calc non Af Amer: >60 mL/min (ref >60)
GFR calc non Af Amer: >60 mL/min (ref >60)
GFR calc non Af Amer: >60 mL/min (ref >60)
GFR calc non Af Amer: >60 mL/min (ref >60)
GFR calc non Af Amer: >60 mL/min (ref >60)
Glucose, Bld: 137 mg/dL — ABNORMAL HIGH (ref 70–99)
Glucose, Bld: 126 mg/dL — ABNORMAL HIGH (ref 70–99)
Glucose, Bld: 99 mg/dL (ref 70–99)
Glucose, Bld: 105 mg/dL — ABNORMAL HIGH (ref 70–99)
Glucose, Bld: 97 mg/dL (ref 70–99)
Glucose, Bld: 113 mg/dL — ABNORMAL HIGH (ref 70–99)
Potassium: 3.2 mmol/L — ABNORMAL LOW (ref 3.5–5.1)
Potassium: 3 mmol/L — ABNORMAL LOW (ref 3.5–5.1)
Potassium: 2.9 mmol/L — ABNORMAL LOW (ref 3.5–5.1)
Potassium: 3.1 mmol/L — ABNORMAL LOW (ref 3.5–5.1)
Potassium: 3.3 mmol/L — ABNORMAL LOW (ref 3.5–5.1)
Potassium: 3.1 mmol/L — ABNORMAL LOW (ref 3.5–5.1)
Sodium: 151 mmol/L — ABNORMAL HIGH (ref 135–145)
Sodium: 151 mmol/L — ABNORMAL HIGH (ref 135–145)
Sodium: 152 mmol/L — ABNORMAL HIGH (ref 135–145)
Sodium: 150 mmol/L — ABNORMAL HIGH (ref 135–145)
Sodium: 148 mmol/L — ABNORMAL HIGH (ref 135–145)
Sodium: 149 mmol/L — ABNORMAL HIGH (ref 135–145)

## 2019-03-05 LAB — HIV ANTIBODY (ROUTINE TESTING W REFLEX): HIV Screen 4th Generation wRfx: NONREACTIVE

## 2019-03-05 LAB — SARS CORONAVIRUS 2 (TAT 6-24 HRS): SARS Coronavirus 2: NEGATIVE

## 2019-03-05 LAB — MAGNESIUM: Magnesium: 2.3 mg/dL (ref 1.7–2.4)

## 2019-03-05 MED ORDER — POTASSIUM CHLORIDE 10 MEQ/100ML IV SOLN
10.0000 meq | INTRAVENOUS | Status: AC
  Administered 2019-03-05 (×3): 10 meq via INTRAVENOUS
  Filled 2019-03-05 (×4): qty 100

## 2019-03-05 MED ORDER — POTASSIUM CHLORIDE 10 MEQ/100ML IV SOLN
10.0000 meq | INTRAVENOUS | Status: AC
  Administered 2019-03-05: 10 meq via INTRAVENOUS
  Filled 2019-03-05 (×4): qty 100

## 2019-03-05 MED ORDER — DEXTROSE 5 % IV SOLN
INTRAVENOUS | Status: DC
  Administered 2019-03-05: 09:00:00 via INTRAVENOUS

## 2019-03-05 NOTE — ED Notes (Signed)
Pt changed and peri care provided 

## 2019-03-05 NOTE — ED Notes (Signed)
Speech therapy at bedside.

## 2019-03-05 NOTE — Consult Note (Signed)
PHARMACY CONSULT NOTE - FOLLOW UP  Pharmacy Consult for Electrolyte Monitoring and Replacement   Recent Labs: Potassium (mmol/L)  Date Value  03/05/2019 3.1 (L)   Magnesium (mg/dL)  Date Value  03/05/2019 2.3   Calcium (mg/dL)  Date Value  03/05/2019 8.8 (L)   Albumin (g/dL)  Date Value  03/04/2019 4.3   Sodium (mmol/L)  Date Value  03/05/2019 150 (H)     Assessment: Pharmacy has been consuilted to monitor/replenish electrolytes in this 68 yo female with cough/SOB/hypokalemia.  Goal of Therapy:  Electrolytes wnl's  Plan:  Pt has received 1 dose of IV potassium this am with potassium level currently 3.1.    Mg wnl's.  Will give KCl 53meq IV x 4 and recheck K with AM labs.  Will consider starting daily po KCL 2meq per home meds list when pt can swallow meds whole.  Oswald Hillock ,PharmD, BCPS Clinical Pharmacist 03/05/2019 3:36 PM

## 2019-03-05 NOTE — Consult Note (Addendum)
PHARMACY CONSULT NOTE - FOLLOW UP  Pharmacy Consult for Electrolyte Monitoring and Replacement   Recent Labs: Potassium (mmol/L)  Date Value  03/05/2019 3.1 (L)   Magnesium (mg/dL)  Date Value  03/05/2019 2.3   Calcium (mg/dL)  Date Value  03/05/2019 8.8 (L)   Albumin (g/dL)  Date Value  03/04/2019 4.3   Sodium (mmol/L)  Date Value  03/05/2019 150 (H)     Assessment: Pharmacy has been consuilted to monitor/replenish electrolytes in this 68 yo female with cough/SOB/hypokalemia.  Goal of Therapy:  Electrolytes wnl's  Plan:  Pt has received 1 dose of IV potassium this am with potassium level currently 3.1.    Mg wnl's.  Will give KCl 67meq IV x 4 and recheck K @ 2000.  Will consider starting daily po KCL 69meq per home meds list when pt can swallow meds whole.  Lu Duffel ,PharmD Clinical Pharmacist 03/05/2019 1:56 PM

## 2019-03-05 NOTE — ED Notes (Signed)
Report to Amanda RN

## 2019-03-05 NOTE — Evaluation (Signed)
Physical Therapy Evaluation Patient Details Name: Kianni Lheureux MRN: [id number] DOB: 1950-04-28 Today's Date: 03/05/2019   History of Present Illness  68 y.o. female with history of dementia, hypertension, here with cough and shortness of breath.  The patient had an episode last Wednesday while at the beach.  She reportedly has dementia and accidentally got into and was witnessed to be eating deodorant gel balls at her beach house.  She was foaming at the mouth at that time.  She was seen in the ED on Wednesday with normal chest x-ray and sent home.  She went back on Friday and was started on Augmentin for possible aspiration pneumonia.  Since then, she has had progressively worsening weakness, cough, shortness of breath, and decreased level of consciousness  Clinical Impression  Pt showed good effort with session as she was able to get out of bed, stand, maintain balance, ambulate w/o AD all w/o physical assist.  Caregiver (x5 years) was present and reports that she looks as though she is at her baseline with regard to mobility, strength, balance.  Apart from baseline confusion she did not have any safety concerns and should not require further PT intervention.  Will complete PT orders, no further needs.  Follow Up Recommendations No PT follow up    Equipment Recommendations  None recommended by PT    Recommendations for Other Services       Precautions / Restrictions Precautions Precautions: Fall Restrictions Weight Bearing Restrictions: No      Mobility  Bed Mobility Overal bed mobility: Independent             General bed mobility comments: Pt needed multiple and varying cues, but able to get to EOB w/o phyiscal assist  Transfers Overall transfer level: Modified independent Equipment used: None Transfers: Sit to/from Stand           General transfer comment: Pt able to rise to standing w/o assist, showed good safety apart from needing constant directional/orientational  cuing  Ambulation/Gait Ambulation/Gait assistance: Modified independent (Device/Increase time) Gait Distance (Feet): 75 Feet Assistive device: None       General Gait Details: Pt able to walk with good speed, safety and confidence in room.  Most difficulty arising from cognitive awareness and needing constant directional cuing.  No LOBs or other issues.  Stairs            Wheelchair Mobility    Modified Rankin (Stroke Patients Only)       Balance Overall balance assessment: Modified Independent                                           Pertinent Vitals/Pain Pain Assessment: Faces Faces Pain Scale: No hurt    Home Living Family/patient expects to be discharged to:: Private residence Living Arrangements: Spouse/significant other Available Help at Discharge: Personal care attendant;Available 24 hours/day;Family Type of Home: House                Prior Function Level of Independence: Needs assistance   Gait / Transfers Assistance Needed: Pt walking outside most days, stays active, rides in car with caregiver often  ADL's / Homemaking Assistance Needed: caregiver helps her dress, gives shower, etc        Hand Dominance        Extremity/Trunk Assessment   Upper Extremity Assessment Upper Extremity Assessment: Difficult to assess due to impaired  cognition;Overall [facility] for tasks assessed    Lower Extremity Assessment Lower Extremity Assessment: Difficult to assess due to impaired cognition;Overall WFL for tasks assessed       Communication   Communication: Expressive difficulties  Cognition Arousal/Alertness: Awake/alert Behavior During Therapy: Flat affect Overall Cognitive Status: History of cognitive impairments - at baseline                                 General Comments: significant dementia, very limited verbalization, extra time/cuing for simple instructions      General Comments      Exercises      Assessment/Plan    PT Assessment Patent does not need any further PT services  PT Problem List         PT Treatment Interventions      PT Goals (Current goals can be found in the Care Plan section)  Acute Rehab PT Goals Patient Stated Goal: pt unable, caregiver hopes to insure eating improves and take her home PT Goal Formulation: Patient unable to participate in goal setting(caregiver) Time For Goal Achievement: 03/19/19 Potential to Achieve Goals: Good    Frequency     Barriers to discharge        Co-evaluation               AM-PAC PT "6 Clicks" Mobility  Outcome Measure Help needed turning from your back to your side while in a flat bed without using bedrails?: None Help needed moving from lying on your back to sitting on the side of a flat bed without using bedrails?: None Help needed moving to and from a bed to a chair (including a wheelchair)?: None Help needed standing up from a chair using your arms (e.g., wheelchair or bedside chair)?: None Help needed to walk in hospital room?: None Help needed climbing 3-5 steps with a railing? : None 6 Click Score: 24    End of Session Equipment Utilized During Treatment: Gait belt Activity Tolerance: Patient tolerated treatment well Patient left: in bed;with call bell/phone within reach;with family/visitor present   PT Visit Diagnosis: Muscle weakness (generalized) (M62.81)    Time: 1030-1100 PT Time Calculation (min) (ACUTE ONLY): 30 min   Charges:   PT Evaluation $PT Eval Low Complexity: 1 Low          Kreg Shropshire, DPT 03/05/2019, 2:12 PM

## 2019-03-05 NOTE — ED Notes (Signed)
PT at bedside.

## 2019-03-05 NOTE — ED Notes (Signed)
Pt sleeping. NAD. VSS.

## 2019-03-05 NOTE — ED Notes (Addendum)
Posey Pronto, MD at bedside with patient and patient's family.

## 2019-03-05 NOTE — ED Notes (Signed)
Lab at bedside

## 2019-03-05 NOTE — ED Notes (Signed)
Family at bedside. 

## 2019-03-05 NOTE — ED Notes (Signed)
PT brief changed at this time, pt clean and dry

## 2019-03-05 NOTE — Progress Notes (Signed)
OT Screen Note  Patient Details Name: Kelsey Mitchell MRN: [id number] DOB: February 04, 1951   Cancelled Treatment:    Reason Eval/Treat Not Completed: OT screened, no needs identified, will sign off. Consult received, chart reviewed. Per PT and caregiver pt is at her baseline, no change in functional ability indicating skilled OT needs are necessary. Will sign off. Please re-consult if additional acute OT needs arise. Thank you for this consult.   Jeni Salles, MPH, MS, OTR/L ascom [phone] 03/05/19, 1:46 PM

## 2019-03-05 NOTE — ED Notes (Signed)
Pt stuck multiple times for blood draw for bmp, unsuccessful at this time. Lab called for blood draw

## 2019-03-05 NOTE — Progress Notes (Signed)
[facility] Progress Note  Patient: Kelsey Mitchell EUM:[id number]   PCP: [facility] DOB: 1950-10-28   DOA: 03/04/2019   DOS: 03/05/2019   Date of Service: the patient was seen and examined on 03/05/2019  Chief Complaint  Patient presents with  . possbile pneumonia   Brief hospital course: Roselina Burgueno is a 68 y.o. female with Past medical history of dementia, HTN, urinary incontinence, restless leg syndrome, HLD. Patient presented with complaints of cough and shortness of breath. Reportedly patient went to her beach house where she ate a bunch of deodorant gel balls.  She had some foaming from the mouth at that time and had some cough. She went to ED that day in 3 hours and with normal chest x-ray she was sent home. She went to ED again 2 days later due to persistent cough at which time they identified a possible pneumonia on the chest x-ray and patient was started on Augmentin. Patient continues to remain weak and fatigued and tired.  Has poor p.o. intake.  No nausea or vomiting.  Patient was also more confused than her baseline and therefore husband called the PCP and patient was informed that she should come to the ER for further evaluation. At the time of my evaluation patient denies any complaints of chest pain abdominal pain.  No nausea no vomiting. Patient does have difficulty swallowing at her baseline.  Currently further plan is continue IV antibiotics.  Further work-up for dysphagia.  Subjective: Does not have any acute complaints no acute events overnight.  Continues to have cough  Assessment and Plan: Scheduled Meds: . enoxaparin (LOVENOX) injection  40 mg Subcutaneous Q24H  . rOPINIRole  3 mg Oral QHS  . sodium chloride flush  3 mL Intravenous Q12H   Continuous Infusions: . sodium chloride    . ampicillin-sulbactam (UNASYN) IV Stopped (03/05/19 1556)  . dextrose Stopped (03/05/19 1521)   PRN Meds: sodium chloride, sodium chloride flush  1.   Aspiration pneumonia Speech therapy consulted. Patient remains n.p.o. for now. We will treat with IV Unasyn. Follow-up on cultures. Chest vest as the patient appears to have poor cough.  2.  Dementia. Restless leg syndrome. Continue home medication for now.  3.  Hypernatremia. From poor p.o. intake or Patient is on D5 half-normal saline. Recheck morning BMP every 4 hours.  4.  Urinary incontinence. Patient is on Myrbetriq. We will resume once the patient is able to swallow safely.  5.  Dysphagia. Speech therapy consulted. We will get MRI brain to rule out any acute abnormality. Do not think that deodorizer and beads are actually causing patient's dysphagia.   Diet: N.p.o. until speech therapy evaluation is completed. DVT Prophylaxis: Subcutaneous Lovenox   Advance goals of care discussion: DNR  Family Communication: no family was present at bedside, at the time of interview.  Caregiver was present at the bedside, the pt provided permission to discuss medical plan with the family. Opportunity was given to ask question and all questions were answered satisfactorily.   Disposition:  Discharge to be determined.  Consultants: none Procedures: none  Antibiotics: Anti-infectives (From admission, onward)   Start     Dose/Rate Route Frequency Ordered Stop   03/04/19 2000  Ampicillin-Sulbactam (UNASYN) 3 g in sodium chloride 0.9 % 100 mL IVPB     3 g 200 mL/hr over 30 Minutes Intravenous Every 6 hours 03/04/19 1903     03/04/19 1400  piperacillin-tazobactam (ZOSYN) IVPB 3.375 g     3.375 g  12.5 mL/hr over 240 Minutes Intravenous Once 03/04/19 1335 03/04/19 1452       Objective: Physical Exam: Vitals:   03/05/19 1530 03/05/19 1602 03/05/19 1700 03/05/19 1743  BP: 136/64 (!) 142/72 137/71   Pulse: 61 68 64   Resp:  20 18   Temp:   98.4 F (36.9 C)   TempSrc:   Oral   SpO2: 99% 99% 94%   Weight:    63.5 kg  Height:    5\' 3"  (1.6 m)    Intake/Output Summary  (Last 24 hours) at 03/05/2019 1904 Last data filed at 03/05/2019 1743 Gross per 24 hour  Intake 1690.32 ml  Output -  Net 1690.32 ml   Filed Weights   03/04/19 1241 03/05/19 1743  Weight: 63.5 kg 63.5 kg   General: alert and oriented to place and person. Appear in moderate distress, affect flat in affect Eyes: PERRL, Conjunctiva normal ENT: Oral Mucosa Clear, moist  Neck: difficult to assess  JVD, no Abnormal Mass Or lumps Cardiovascular: S1 and S2 Present, no Murmur,  Respiratory: increased respiratory effort, Bilateral Air entry equal and Decreased, no signs of accessory muscle use, bilateral upper airway crackles, no wheezes Abdomen: Bowel Sound present, Soft and no tenderness, no hernia Skin: no rashes  Extremities: no Pedal edema, no calf tenderness Neurologic: without any new focal findings Gait not checked due to patient safety concerns  Data Reviewed: I have personally reviewed and interpreted daily labs, tele strips, imagings as discussed above. I reviewed all nursing notes, pharmacy notes, vitals, pertinent old records I have discussed plan of care as described above with RN and patient/family.  CBC: Recent Labs  Lab 03/04/19 1252 03/05/19 0508  WBC 10.9* 13.1*  NEUTROABS 9.1* 11.0*  HGB 16.4* 13.6  HCT 52.3* 43.5  MCV 98.9 98.9  PLT 288 194   Basic Metabolic Panel: Recent Labs  Lab 03/04/19 1252 03/05/19 0112 03/05/19 0508 03/05/19 0900 03/05/19 1252  NA 152* 151* 151* 152* 150*  K 3.6 3.2* 3.0* 2.9* 3.1*  CL 110 115* 115* 115* 114*  CO2 29 29 29 28 29   GLUCOSE 115* 137* 126* 99 105*  BUN 31* 25* 21 19 17   CREATININE 1.01* 0.83 0.80 0.78 0.73  CALCIUM 9.9 8.4* 8.2* 8.4* 8.8*  MG  --   --  2.3  --   --     Liver Function Tests: Recent Labs  Lab 03/04/19 1252  AST 37  ALT 35  ALKPHOS 84  BILITOT 0.8  PROT 9.0*  ALBUMIN 4.3   No results for input(s): LIPASE, AMYLASE in the last 168 hours. No results for input(s): AMMONIA in the last 168  hours. Coagulation Profile: No results for input(s): INR, PROTIME in the last 168 hours. Cardiac Enzymes: No results for input(s): CKTOTAL, CKMB, CKMBINDEX, TROPONINI in the last 168 hours. BNP (last 3 results) No results for input(s): PROBNP in the last 8760 hours. CBG: Recent Labs  Lab 03/04/19 2050  GLUCAP 99   Studies: Mr Brain Wo Contrast  Result Date: 03/05/2019 CLINICAL DATA:  Altered level of consciousness. EXAM: MRI HEAD WITHOUT CONTRAST TECHNIQUE: Multiplanar, multiecho pulse sequences of the brain and surrounding structures were obtained without intravenous contrast. COMPARISON:  None FINDINGS: Brain: Moderate to advanced atrophy. Advanced atrophy in the medial temporal lobe and hippocampus bilaterally. Negative for acute infarct. No significant chronic ischemia. Negative for hemorrhage or mass. Rapid scanning performed due to motion. Vascular: Normal arterial flow voids. Skull and upper cervical spine: Negative Sinuses/Orbits:  Negative Other: None IMPRESSION: Moderate to advanced atrophy.  No acute abnormality. Electronically Signed   By: Franchot Gallo M.D.   On: 03/05/2019 18:50     Time spent: 35 minutes  Author: Berle Mull, MD [facility] 03/05/2019 7:04 PM  To reach On-call, see care teams to locate the attending and reach out to them via [email or web address]. If 7PM-7AM, please contact night-coverage If you still have difficulty reaching the attending provider, please page the [facility] (Director on Call) for [facility] on amion for assistance.

## 2019-03-05 NOTE — Evaluation (Signed)
Clinical/Bedside Swallow Evaluation Patient Details  Name: Kelsey Mitchell MRN: [id number] Date of Birth: [birth year]  Today's Date: 03/05/2019 Time: SLP Start Time (ACUTE ONLY): 0920 SLP Stop Time (ACUTE ONLY): 1020 SLP Time Calculation (min) (ACUTE ONLY): 60 min  Past Medical History:  Past Medical History:  Diagnosis Date  . Dementia (HCC)   . Hypertension    Past Surgical History: History reviewed. No pertinent surgical history. HPI:  Pt is a 68 y.o. 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  The patient had an episode last Wednesday while at the beach.  She reportedly has dementia and accidentally got into and was witnessed to be eating deodorant/detergent gel balls at her beach house.  She was foaming at the mouth at that time.  She was seen in the ED on Wednesday with normal chest x-ray and sent home.  She went back on Friday and was started on Augmentin for possible aspiration pneumonia.  Since then, she has had progressively worsening weakness, cough, shortness of breath, and decreased level of consciousness.  She essentially is no longer eating or drinking.  She has been unable to take the Augmentin due to difficulty swallowing.  She has been increasingly drowsy.  She has been less responsive.  Her husband called her PCP who told her to come to the ED for evaluation. In the Ed, pt lying in bed in ED. Verbally responsive to few basic questions but not consistently. Noted some Echolalia, Slow responses w/ min Dysarthria and a wet vocal quality noted. Congested coughing baseline.    Assessment / Plan / Recommendation Clinical Impression  BSE completed. Pt presents w/ apparent oropharyngeal phase dysphagia w/ high risk for aspiration thus Pulmonary impact/decline. Pt presents w/ decreased oropharyngeal phase management of own saliva w/ wet vocal quality at baseline. Pt's congested cough was weak and ineffective for clearing  mucous/secretions suspected on vocal cords. Pt exhibited significant slow motor (oral) movements w/ declined lingual/labial strength during swallowing and speech. Noted a slight open-mouth posture at rest, more deliberateness in her oral motor movements/speech. During po trials, pt exhibited adequate mastication attention to the few boluses but fairly consistency delayed, congested coughing. Pharyngeal swallow was weak, delayed for the amount of time the bolus was in the mouth, and there was velopharyngeal incompetency noted intermittently. Similar was noted w/ TSP trials of Nectar liquids. Oral phase management was slow w/ bolsu residue noted to remain post swallow. SLP gave thorough oral care before and after the trials to help clean/clear mouth. Noted bleeding on lips and a coated tongue.  Due to pt's presentation at this time, recommend strict NPO status w/ frequent oral care for hygiene and stimulation of swallowing to help manage own saliva. Aspiration precautions. Support w/ oral care. ST services will f/u w/ pt daily; objective swallow assessment TBD. Discussed above w/ MD and the concern that pt's presentation seemed atypical for general Alz. Dementia. MD will f/u.  SLP Visit Diagnosis: Dysphagia, oropharyngeal phase (R13.12)(baseline Dementia)    Aspiration Risk  Severe aspiration risk;Risk for inadequate nutrition/hydration    Diet Recommendation  NPO w/ strict aspiration precautions; frequent oral care for hygiene and stimulation of swallowing  Medication Administration: Via alternative means    Other  Recommendations Recommended Consults: (Neurology consult to assess; Dietician f/u) Oral Care Recommendations: Oral care QID;Staff/trained caregiver to provide oral care(aspiration precautions) Other Recommendations: (TBD)   Follow up Recommendations (TBD)      Frequency and Duration min 3x week  2 weeks       Prognosis Prognosis for Safe Diet Advancement: Guarded Barriers to Reach  Goals: Cognitive deficits;Time post onset;Severity of deficits Barriers/Prognosis Comment: baseline deficits      Swallow Study   General Date of Onset: 03/04/19 HPI: Pt is a 68 y.o. 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  The patient had an episode last Wednesday while at the beach.  She reportedly has dementia and accidentally got into and was witnessed to be eating deodorant/detergent gel balls at her beach house.  She was foaming at the mouth at that time.  She was seen in the ED on Wednesday with normal chest x-ray and sent home.  She went back on Friday and was started on Augmentin for possible aspiration pneumonia.  Since then, she has had progressively worsening weakness, cough, shortness of breath, and decreased level of consciousness.  She essentially is no longer eating or drinking.  She has been unable to take the Augmentin due to difficulty swallowing.  She has been increasingly drowsy.  She has been less responsive.  Her husband called her PCP who told her to come to the ED for evaluation. In the Ed, pt lying in bed in ED. Verbally responsive to few basic questions but not consistently. Noted some Echolalia, Slow responses w/ min Dysarthria and a wet vocal quality noted. Congested coughing baseline.  Type of Study: Bedside Swallow Evaluation Previous Swallow Assessment: none Diet Prior to this Study: NPO(regular diet at home it seems per chart) Temperature Spikes Noted: No(wbc 13.1) Respiratory Status: Nasal cannula(1L) History of Recent Intubation: No Behavior/Cognition: Alert;Cooperative;Pleasant mood;Confused;Requires cueing(slow, deliberate speech; dysarthria - min) Oral Cavity Assessment: Excessive secretions;Erythema(bleeding lips) Oral Care Completed by SLP: Yes Oral Cavity - Dentition: Adequate natural dentition Vision: (n/a) Self-Feeding Abilities: Total assist Patient Positioning: Upright in bed(needed  positioning fully) Baseline Vocal Quality: Wet;Low vocal intensity Volitional Cough: Congested;Weak Volitional Swallow: Unable to elicit    Oral/Motor/Sensory Function Overall Oral Motor/Sensory Function: Moderate impairment Facial ROM: (generalized weakness) Facial Symmetry: Within Functional Limits Facial Strength: (generalized weakness) Lingual ROM: Within Functional Limits(grossly) Lingual Symmetry: Within Functional Limits Lingual Strength: Reduced;Suspected CN XII (hypoglossal) dysfunction Velum: (CNT) Mandible: (open mouth posture slightly)   Ice Chips Ice chips: Impaired Presentation: Spoon(fed; 4 trials) Oral Phase Impairments: Reduced lingual movement/coordination;Reduced labial seal(good mastication) Oral Phase Functional Implications: Prolonged oral transit;Oral residue Pharyngeal Phase Impairments: Suspected delayed Swallow;Decreased hyoid-laryngeal movement;Unable to trigger swallow;Cough - Delayed   Thin Liquid Thin Liquid: Not tested    Nectar Thick Nectar Thick Liquid: Impaired Presentation: Spoon(fed; 6 trials) Oral Phase Impairments: Reduced labial seal;Reduced lingual movement/coordination;Poor awareness of bolus Oral phase functional implications: Prolonged oral transit;Oral residue;Oral holding Pharyngeal Phase Impairments: Suspected delayed Swallow;Decreased hyoid-laryngeal movement;Unable to trigger swallow;Cough - Delayed   Honey Thick Honey Thick Liquid: Not tested   Puree Puree: Not tested   Solid     Solid: Not tested       Orinda Kenner, MS, CCC-SLP Jaylun Fleener 03/05/2019,11:59 AM

## 2019-03-06 DIAGNOSIS — Z66 Do not resuscitate: Secondary | ICD-10-CM

## 2019-03-06 DIAGNOSIS — A419 Sepsis, unspecified organism: Secondary | ICD-10-CM

## 2019-03-06 DIAGNOSIS — J189 Pneumonia, unspecified organism: Secondary | ICD-10-CM

## 2019-03-06 DIAGNOSIS — J9601 Acute respiratory failure with hypoxia: Secondary | ICD-10-CM

## 2019-03-06 DIAGNOSIS — Z515 Encounter for palliative care: Secondary | ICD-10-CM

## 2019-03-06 LAB — BASIC METABOLIC PANEL
Anion gap: 5 (ref 5–15)
Anion gap: 9 (ref 5–15)
BUN: 13 mg/dL (ref 8–23)
BUN: 11 mg/dL (ref 8–23)
CO2: 28 mmol/L (ref 22–32)
CO2: 27 mmol/L (ref 22–32)
Calcium: 8.3 mg/dL — ABNORMAL LOW (ref 8.9–10.3)
Calcium: 8.6 mg/dL — ABNORMAL LOW (ref 8.9–10.3)
Chloride: 112 mmol/L — ABNORMAL HIGH (ref 98–111)
Chloride: 109 mmol/L (ref 98–111)
Creatinine, Ser: 0.66 mg/dL (ref 0.44–1.00)
Creatinine, Ser: 0.74 mg/dL (ref 0.44–1.00)
GFR calc Af Amer: >60 mL/min (ref >60)
GFR calc Af Amer: >60 mL/min (ref >60)
GFR calc non Af Amer: >60 mL/min (ref >60)
GFR calc non Af Amer: >60 mL/min (ref >60)
Glucose, Bld: 100 mg/dL — ABNORMAL HIGH (ref 70–99)
Glucose, Bld: 97 mg/dL (ref 70–99)
Potassium: 3.2 mmol/L — ABNORMAL LOW (ref 3.5–5.1)
Potassium: 3.8 mmol/L (ref 3.5–5.1)
Sodium: 145 mmol/L (ref 135–145)
Sodium: 145 mmol/L (ref 135–145)

## 2019-03-06 MED ORDER — POTASSIUM CHLORIDE 10 MEQ/100ML IV SOLN
10.0000 meq | Freq: Once | INTRAVENOUS | Status: AC
  Administered 2019-03-06: 10 meq via INTRAVENOUS
  Filled 2019-03-06: qty 100

## 2019-03-06 MED ORDER — POTASSIUM CHLORIDE 10 MEQ/100ML IV SOLN
10.0000 meq | INTRAVENOUS | Status: AC
  Administered 2019-03-06 (×3): 10 meq via INTRAVENOUS
  Filled 2019-03-06 (×3): qty 100

## 2019-03-06 MED ORDER — DEXTROSE 5 % IV SOLN
INTRAVENOUS | Status: DC
  Administered 2019-03-06: 09:00:00 via INTRAVENOUS

## 2019-03-06 NOTE — Progress Notes (Signed)
Speech Language Pathology Treatment: Dysphagia  Patient Details Name: Kelsey Mitchell MRN: [id number] DOB: 1950/09/16 Today's Date: 03/06/2019 Time: 7829-5621 SLP Time Calculation (min) (ACUTE ONLY): 80 min  Assessment / Plan / Recommendation Clinical Impression  Pt seen for ongoing assessment of swallowing and toleration of po's; initiation of an oral diet if appropriate. Pt appeared more alert/awake w/ her Caregiver in the room w/ her, Kelsey Mitchell. Speech was actually a little stronger; fairy clear, but Dysarthria remains. Caregiver stated the speech was "about like it was at home in the past month". Oral care given; pt has an open-mouth posture. Pt continues to present w/ apparent oropharyngeal phase dysphagia w/ increased risk for aspiration thus Pulmonary impact/decline. However, pt appears to present w/ improved Cognitive attention and awareness to swallowing this session. Pt presents w/ min decreased oropharyngeal phase management of own saliva and similar mild, congested cough which is weak and ineffective for fully clearing mucous/secretions. LESS wet vocal quality at rest and during po's. Pt exhibited significant slow motor (oral) movements w/ reduced lingual/labial strength during swallowing and speech but overall min improved from eval. Noted a slight open-mouth posture at rest, more deliberateness in her oral motor movements/speech. During po trials, pt exhibited fair mastication attention to the few boluses of ice chips but still delayed initiation of pharyngeal swallow. Pharyngeal swallow was min weak, delayed for the amount of time the bolus was in the mouth, and there was mild velopharyngeal incompetency noted intermittently. This was noted w/ TSP trials of Nectar liquids and Purees. Oral phase management was slow but A-P transfer achieved w/ slight bolus residue noted to remain post swallow only. The mild congested coughing occurred intermittently during the po trials but did not appear  immediately related to each po trial given. SLP gave thorough oral care before and after the trials to help clean/clear mouth. Noted less bleeding on lips and a coated tongue.  MD consulted re: pt's presentation today and the recommendation for Dysphagia 1 diet w/ Nectar liquids VIA TSP w/ strict aspiration precautions and feeding support -- careful monitoring of all bites/sips taken then swallowed/cleared orally. Rec. Frequent Rest Breaks to avoid fatigue w/ task. Frequent oral care for hygiene and stimulation of swallowing to help manage own saliva. Necessary Pills Crushed in Puree. ST services will f/u w/ pt daily; objective swallow assessment TBD. NSG updated.      HPI HPI: Pt is a 68 y.o. 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  The patient had an episode last Wednesday while at the beach.  She reportedly has dementia and accidentally got into and was witnessed to be eating deodorant/detergent gel balls at her beach house.  She was foaming at the mouth at that time.  She was seen in the ED on Wednesday with normal chest x-ray and sent home.  She went back on Friday and was started on Augmentin for possible aspiration pneumonia.  Since then, she has had progressively worsening weakness, cough, shortness of breath, and decreased level of consciousness.  She essentially is no longer eating or drinking.  She has been unable to take the Augmentin due to difficulty swallowing.  She has been increasingly drowsy.  She has been less responsive.  Her husband called her PCP who told her to come to the ED for evaluation. In the Ed, pt lying in bed in ED. Verbally responsive to few basic questions but not consistently. Noted some Echolalia, Slow responses w/ min Dysarthria and a  wet vocal quality noted. Mild congested coughing baseline.       SLP Plan  Continue with current plan of care       Recommendations  Diet recommendations: Dysphagia 1  (puree);Nectar-thick liquid Liquids provided via: Teaspoon Medication Administration: Crushed with puree(for safer swallowing) Supervision: Staff to assist with self feeding;Full supervision/cueing for compensatory strategies Compensations: Minimize environmental distractions;Slow rate;Small sips/bites;Lingual sweep for clearance of pocketing;Multiple dry swallows after each bite/sip;Follow solids with liquid(Check for swallow and clearing; watch for fatigue) Postural Changes and/or Swallow Maneuvers: Seated upright 90 degrees;Upright 30-60 min after meal(rest breaks)                General recommendations: (Dietician f/u) Oral Care Recommendations: Oral care BID;Oral care before and after PO;Staff/trained caregiver to provide oral care Follow up Recommendations: (TBD) SLP Visit Diagnosis: Dysphagia, oropharyngeal phase (R13.12)(baseline Dementia) Plan: Continue with current plan of care       GO                 Jerilynn Som, MS, CCC-SLP Kelsey Mitchell,Kelsey Mitchell 03/06/2019, 12:31 PM

## 2019-03-06 NOTE — Progress Notes (Signed)
New referral for [facility] program to follow post discharge received from Palliative NP Athena Pickenpack-Cousar. CMRN Deliliah Belenda Cruise made aware. Patient information given to referral. Flo Shanks BSN, RN, [place] [phone]

## 2019-03-06 NOTE — Consult Note (Signed)
PHARMACY CONSULT NOTE - FOLLOW UP  Pharmacy Consult for Electrolyte Monitoring and Replacement   Recent Labs: Potassium (mmol/L)  Date Value  03/06/2019 3.2 (L)   Magnesium (mg/dL)  Date Value  03/05/2019 2.3   Calcium (mg/dL)  Date Value  03/06/2019 8.3 (L)   Albumin (g/dL)  Date Value  03/04/2019 4.3   Sodium (mmol/L)  Date Value  03/06/2019 145     Assessment: Pharmacy has been consuilted to monitor/replenish electrolytes in this 69 yo female with cough/SOB/hypokalemia.  Patient remains hypokalemic (3.2). MD already KCL 10 mEq x4 runs.   Goal of Therapy:  Electrolytes wnl's  Plan:  No additional electrolyte replacement today. Will check electrolytes with AM labs.   Will consider starting daily po KCL 6meq per home meds list when pt can swallow meds whole.  Rowland Lathe ,PharmD Clinical Pharmacist 03/06/2019 7:32 AM

## 2019-03-06 NOTE — Progress Notes (Signed)
[facility] Progress Note  Patient: Kelsey Mitchell UKG:[id number]   PCP: [facility] DOB: 03-Apr-1950   DOA: 03/04/2019   DOS: 03/06/2019   Date of Service: the patient was seen and examined on 03/06/2019  Chief Complaint  Patient presents with  . Kelsey Mitchell pneumonia   Brief hospital course: Kelsey Mitchell is a 68 y.o. female with Past medical history of dementia, HTN, urinary incontinence, restless leg syndrome, HLD. Patient presented with complaints of cough and shortness of breath. Reportedly patient went to her beach house where she ate a bunch of deodorant gel balls.  She had some foaming from the mouth at that time and had some cough. She went to ED that day in 3 hours and with normal chest x-ray she was sent home. She went to ED again 2 days later due to persistent cough at which time they identified a possible pneumonia on the chest x-ray and patient was started on Augmentin. Patient continues to remain weak and fatigued and tired.  Has poor p.o. intake.  No nausea or vomiting.  Patient was also more confused than her baseline and therefore husband called the PCP and patient was informed that she should come to the ER for further evaluation. At the time of my evaluation patient denies any complaints of chest pain abdominal pain.  No nausea no vomiting. Patient does have difficulty swallowing at her baseline.  Currently further plan is continue IV antibiotics.  Further work-up for dysphagia.  Subjective: Continues to have confusion.  No nausea no vomiting no fever no chills.  Assessment and Plan: Scheduled Meds: . enoxaparin (LOVENOX) injection  40 mg Subcutaneous Q24H  . rOPINIRole  3 mg Oral QHS  . sodium chloride flush  3 mL Intravenous Q12H   Continuous Infusions: . sodium chloride Stopped (03/06/19 0225)  . ampicillin-sulbactam (UNASYN) IV Stopped (03/06/19 1746)  . dextrose 10 mL/hr at 03/06/19 1800   PRN Meds: sodium chloride, sodium chloride flush   1.  Aspiration pneumonia Speech therapy consulted. Appreciate assistance. Currently on dysphagia 1 diet. Monitor. We will treat with IV Unasyn. Follow-up on cultures. Chest vest as the patient appears to have poor cough.  2.  Dementia. Restless leg syndrome. Continue home medication for now. Palliative care consult for goals of care discussion.  Appreciate assistance.  3.  Hypernatremia. From poor p.o. intake or Patient is on D5 half-normal saline. Recheck morning BMP every 4 hours.  4.  Urinary incontinence. Patient is on Myrbetriq. We will resume once the patient is able to swallow safely.  5.  Dysphagia. Speech therapy consulted. MRI brain negative for any acute stroke. Do not think that deodorizer and beads are actually causing patient's dysphagia.   Diet: Dysphagia 1 diet DVT Prophylaxis: Subcutaneous Lovenox   Advance goals of care discussion: DNR  Family Communication: no family was present at bedside, at the time of interview.  Caregiver was present at the bedside, the pt provided permission to discuss medical plan with the family. Opportunity was given to ask question and all questions were answered satisfactorily.   Disposition:  Discharge to be determined.  Consultants: none Procedures: none  Antibiotics: Anti-infectives (From admission, onward)   Start     Dose/Rate Route Frequency Ordered Stop   03/04/19 2000  Ampicillin-Sulbactam (UNASYN) 3 g in sodium chloride 0.9 % 100 mL IVPB     3 g 200 mL/hr over 30 Minutes Intravenous Every 6 hours 03/04/19 1903     03/04/19 1400  piperacillin-tazobactam (ZOSYN) IVPB 3.375  g     3.375 g 12.5 mL/hr over 240 Minutes Intravenous Once 03/04/19 1335 03/04/19 1452       Objective: Physical Exam: Vitals:   03/05/19 1743 03/05/19 2118 03/05/19 2304 03/06/19 1632  BP:  117/60 (!) 123/55 (!) 151/93  Pulse:  (!) 59 65 75  Resp:  (!) 26 17 14   Temp:  98.4 F (36.9 C) 98.5 F (36.9 C) 98.6 F (37 C)   TempSrc:  Axillary    SpO2:  95% 91% 93%  Weight: 63.5 kg     Height: 5\' 3"  (1.6 m)       Intake/Output Summary (Last 24 hours) at 03/06/2019 1925 Last data filed at 03/06/2019 1800 Gross per 24 hour  Intake 654.94 ml  Output 200 ml  Net 454.94 ml   Filed Weights   03/04/19 1241 03/05/19 1743  Weight: 63.5 kg 63.5 kg   General: alert and oriented to place and person. Appear in moderate distress, affect flat in affect Eyes: PERRL, Conjunctiva normal ENT: Oral Mucosa Clear, moist  Neck: difficult to assess  JVD, no Abnormal Mass Or lumps Cardiovascular: S1 and S2 Present, no Murmur,  Respiratory: increased respiratory effort, Bilateral Air entry equal and Decreased, no signs of accessory muscle use, bilateral upper airway crackles, no wheezes Abdomen: Bowel Sound present, Soft and no tenderness, no hernia Skin: no rashes  Extremities: no Pedal edema, no calf tenderness Neurologic: without any new focal findings Gait not checked due to patient safety concerns  Data Reviewed: I have personally reviewed and interpreted daily labs, tele strips, imagings as discussed above. I reviewed all nursing notes, pharmacy notes, vitals, pertinent old records I have discussed plan of care as described above with RN and patient/family.  CBC: Recent Labs  Lab 03/04/19 1252 03/05/19 0508  WBC 10.9* 13.1*  NEUTROABS 9.1* 11.0*  HGB 16.4* 13.6  HCT 52.3* 43.5  MCV 98.9 98.9  PLT 288 194   Basic Metabolic Panel: Recent Labs  Lab 03/05/19 0508  03/05/19 1252 03/05/19 1914 03/05/19 2250 03/06/19 0300 03/06/19 0926  NA 151*   < > 150* 148* 149* 145 145  K 3.0*   < > 3.1* 3.3* 3.1* 3.2* 3.8  CL 115*   < > 114* 113* 115* 112* 109  CO2 29   < > 29 27 27 28 27   GLUCOSE 126*   < > 105* 97 113* 100* 97  BUN 21   < > 17 14 13 13 11   CREATININE 0.80   < > 0.73 0.77 0.54 0.66 0.74  CALCIUM 8.2*   < > 8.8* 8.4* 8.3* 8.3* 8.6*  MG 2.3  --   --   --   --   --   --    < > = values in this  interval not displayed.    Liver Function Tests: Recent Labs  Lab 03/04/19 1252  AST 37  ALT 35  ALKPHOS 84  BILITOT 0.8  PROT 9.0*  ALBUMIN 4.3   No results for input(s): LIPASE, AMYLASE in the last 168 hours. No results for input(s): AMMONIA in the last 168 hours. Coagulation Profile: No results for input(s): INR, PROTIME in the last 168 hours. Cardiac Enzymes: No results for input(s): CKTOTAL, CKMB, CKMBINDEX, TROPONINI in the last 168 hours. BNP (last 3 results) No results for input(s): PROBNP in the last 8760 hours. CBG: Recent Labs  Lab 03/04/19 2050  GLUCAP 99   Studies: No results found.   Time spent: 35 minutes  Author: Berle Mull, MD [facility] 03/06/2019 7:25 PM  To reach On-call, see care teams to locate the attending and reach out to them via [email or web address]. If 7PM-7AM, please contact night-coverage If you still have difficulty reaching the attending provider, please page the [facility] (Director on Call) for [facility] on amion for assistance.

## 2019-03-06 NOTE — Consult Note (Signed)
Consultation Note Date: 03/06/2019   Patient Name: Kelsey Mitchell  DOB: [birth year]  MRN: [id number]  Age / Sex: 68 y.o., female   PCP: [facility] Referring Physician: Lavina Hamman, MD   REASON FOR CONSULTATION:Establishing goals of care  Palliative Care consult requested for goals of care in this 68 y.o. female with multiple medical problems including dementia, hypertension, restless leg syndrome, hyperlipidemia, and urinary incontinence.  Patient presented to ED with complaints of cough or shortness of breath.  It was reported patient was at the beach a week prior and ingested an undisclosed amount of aromatherapy gel balls. Per caregiver was seen at the local ED there with a normal chest x-ray and sent home for monitoring.  2 days later she was seen again in the ED due to persistent cough and at that time chest x-ray showed pneumonia.  She was started on Augmentin.  Per reports patient continued to show signs of weakness, fatigue, worsening confusion, and congestion.  Has been contacted PCP who informed family to bring patient to ED for further evaluation.  During her ED work-up patient was initially hypoxic.  Chest x-ray showed possible pneumonia.  Patient started on IV Unasyn.  Since admission patient seen by SLP recommendations for dysphagia 1 nectar thick diet.  Clinical Assessment and Goals of Care: I have reviewed medical records including lab results, imaging, Epic notes, and MAR, received report from the bedside RN, and assessed the patient. I met at the bedside with patient's hired caregiver, [name] and spoke over the phone with her husband, Emmanuel Ercole to discuss diagnosis prognosis, [place], EOL wishes, disposition and options. Patient is awake, alert, but confused. She would follow some simple commands and responded to yes and no questions.   I introduced Palliative Medicine as specialized medical care for people living with serious illness. It focuses on providing  relief from the symptoms and stress of a serious illness. The goal is to improve quality of life for both the patient and the family.  We discussed a brief life review of the patient, along with her functional and nutritional status. Patient is a retired Nurse, adult at [employer]. She and her husband have been married for more than 50 years and have 2 children. Patient lives at home with private caregiver, [name] who is there with her 24/7. [name] reports patient sometimes stays over to her house. Her husband is a driver for the basketball legend [employer] and travels often requiring a paid caregiver.   Prior to admission patient was incontinent at times. Required assistance with ADLs. She was ambulatory and would walk 2-3 times a week with caregiver or husband around the house. It was reported she had a good appetite, however husband reports over the past month noticing patient would eat certain foods and hold in mouth or chew and swallow later to spit back up or out. He states she would do the same when attempting to assist with brushing her teeth requiring him to lean her head over and get her to open and spit out the toothpaste or mouth rinse.   We discussed Her current illness and what it means in the larger context of Her on-going co-morbidities. With specific discussions regarding her aspiration pneumonia and dementia progression. Natural disease trajectory and expectations at EOL were discussed.  I educated husband at length on signs of dementia progression and concerns that patient is most likely progressing with a coincidence that she swallowed the aroma  balls. I supported this with his previous descriptions of concerns with her eating and brushing teeth. He verbalized understanding and appreciation.   We discussed artificial feeding in dementia patients and the risk and benefits. Mr. Ingles is tearful expressing he would never want to place a feeding tube in her because she  would not be happy with that and he knows she would try to pull it out. Support given. We discussed trajectory and I educated him on what to expect in the setting patient continues to have concerns with dysphagia and her high risk of continued aspiration and pneumonia. Mr. Cozart verbalized understanding and expressed he is remaining hopeful she will somewhat improve, however he is preparing for the worst.   He states "if she gets to that point, I want her to have what she wants and I understand it will be a risk and she will die eventually and I accept that but I will not place a tube in her!" Support given. He states he is not sure his children would understand his rationale although he would hope they would feel the same way. He request not to present or mention his decisions with caregiver or his children if they were to ever inquire.   I attempted to elicit values and goals of care important to the patient.    The difference between aggressive medical intervention and comfort care was considered in light of the patient's goals of care. He wishes to continue to treat the treatable and take it one day at time. He remains hopeful for some improvement and she can get back home. He states "I know that she is going to leave me and it's just a matter of when but I am choosing to deal with it emotionally at that particular moment when she doesn't wake up or the doctors look at me and says she has hours to days!"   Mr. Murry confirms DNR/DNI and no artifical feedings/PEG.   Hospice and Palliative Care services outpatient were explained and offered. He verbalized their understanding and awareness of both palliative and hospice's goals and philosophy of care. At this time he is requesting outpatient palliative support with awareness she will most likely require hospice in the near future. He understands at that point he may transition care by communicating with his medical team.   Questions and concerns were  addressed. The family was encouraged to call with questions or concerns.  PMT will continue to support holistically.   SOCIAL HISTORY:     reports that she has never smoked. She has never used smokeless tobacco. She reports previous alcohol use. She reports that she does not use drugs.  CODE STATUS: DNR  ADVANCE DIRECTIVES:  Dorena Cookey (husband)  SYMPTOM MANAGEMENT: per attending   Palliative Prophylaxis:   Aspiration, Bowel Regimen, Delirium Protocol, Eye Care, Frequent Pain Assessment and Oral Care  PSYCHO-SOCIAL/SPIRITUAL:  Support System: Family   Desire for further Chaplaincy support: NO   Additional Recommendations (Limitations, Scope, Preferences):  Full Scope Treatment and No Artificial Feeding   PAST MEDICAL HISTORY: Past Medical History:  Diagnosis Date  . Dementia ([place])   . Hypertension     PAST SURGICAL HISTORY:  Past Surgical History:  Procedure Laterality Date  . HYSTEROTOMY  unsure    ALLERGIES:  has No Known Allergies.   MEDICATIONS:  Current Facility-Administered Medications  Medication Dose Route Frequency Provider Last Rate Last Dose  . 0.9 %  sodium chloride infusion  250 mL Intravenous PRN Berle Mull  M, MD   Stopped at 03/06/19 0225  . Ampicillin-Sulbactam (UNASYN) 3 g in sodium chloride 0.9 % 100 mL IVPB  3 g Intravenous Q6H Oswald Hillock, RPH 200 mL/hr at 03/06/19 0225 3 g at 03/06/19 0225  . dextrose 5 % solution   Intravenous Continuous Lavina Hamman, MD 10 mL/hr at 03/06/19 [id number]    . enoxaparin (LOVENOX) injection 40 mg  40 mg Subcutaneous Q24H Lavina Hamman, MD   40 mg at 03/05/19 1711  . rOPINIRole (REQUIP) tablet 3 mg  3 mg Oral QHS Berle Mull M, MD      . sodium chloride flush (NS) 0.9 % injection 3 mL  3 mL Intravenous Q12H Lavina Hamman, MD   3 mL at 03/05/19 2128  . sodium chloride flush (NS) 0.9 % injection 3 mL  3 mL Intravenous PRN Lavina Hamman, MD        VITAL SIGNS: BP (!) 151/93 (BP Location: Left Arm)    Pulse 75   Temp 98.6 F (37 C)   Resp 14   Ht '5\' 3"'  (1.6 m)   Wt 63.5 kg   SpO2 93%   BMI 24.80 kg/m  Filed Weights   03/04/19 1241 03/05/19 1743  Weight: 63.5 kg 63.5 kg    Estimated body mass index is 24.8 kg/m as calculated from the following:   Height as of this encounter: '5\' 3"'  (1.6 m).   Weight as of this encounter: 63.5 kg.  LABS: CBC:    Component Value Date/Time   WBC 13.1 (H) 03/05/2019 0508   HGB 13.6 03/05/2019 0508   HCT 43.5 03/05/2019 0508   PLT 194 03/05/2019 0508   Comprehensive Metabolic Panel:    Component Value Date/Time   NA 145 03/06/2019 0926   K 3.8 03/06/2019 0926   CO2 27 03/06/2019 0926   BUN 11 03/06/2019 0926   CREATININE 0.74 03/06/2019 0926   ALBUMIN 4.3 03/04/2019 1252     Review of Systems  Unable to perform ROS: Dementia   Physical Exam General: NAD, frail appearing, thin Cardiovascular: regular rate and rhythm Pulmonary: bilateral rhonchi, audible congestion, weak cough Abdomen: soft, nontender, + bowel sounds Extremities: no edema, no joint deformities Skin: no rashes Neurological: awake, alert to self and caregiver, flat affect   Prognosis: Guarded to Poor in the setting of aspiration pneumonia, dysphagia, advanced dementia, hypernatremia, urinary incontinence, hypertension.   Discharge Planning:  Home with Palliative Services  Recommendations:  DNR/DNI-as confirmed by husband  Continue to treat the treatable per medical team  Husband remains hopeful for some improvement and patient can return home with caregiver support. Detailed discussion/education on dementia progression, dysphagia, risk of aspiration. He has expressed wishes for NO ARTIFICIAL FEEDINGS/PEG with understanding of further decline/EOL in malnutrition setting. Request providers not discuss PEG or feedings around caregivers or children.   Husband is coming into town tonight and requesting to visit patient. This is reasonable and per policy patient  requires family support for decision making and safety in the setting of dementia. Caregiver should be allowed to be here in addition to husband ability to visit as POA/decision maker (with awareness of per department one at time vs. Both at bedside).   Outpatient Palliative at request of husband. (referral placed)  PMT will continue to support and follow. No weekend coverage.    Palliative Performance Scale: PPS 20-30%              Husband expressed understanding and was in  agreement with this plan.   Thank you for allowing the Palliative Medicine Team to assist in the care of this patient.  Time In: 1500 Time Out: 1615 Time Total: 75 min.   Visit consisted of counseling and education dealing with the complex and emotionally intense issues of symptom management and palliative care in the setting of serious and potentially life-threatening illness.Greater than 50%  of this time was spent counseling and coordinating care related to the above assessment and plan.  Signed by:  Alda Lea, AGPCNP-BC Palliative Medicine Team  Phone: [phone] Fax: [phone] Pager: [phone] Amion: Bjorn Pippin

## 2019-03-06 NOTE — Progress Notes (Signed)
I have spoken to Lelon Frohlich, RN [facility]) to discuss approval for husband to be able to visit patient as decision maker in addition to approval for hired caregiver [name] to be available to be at the bedside for support of patient in the setting of advanced dementia. Husband is generally not available to be with patient 24/7 however would like to be allowed to visit to monitor patient's progress, face-to-face updates, and make needed medical decisions.   Approval obtained and Serafina Mitchell she would contact the department and make aware.   Alda Lea, AGPCNP-BC Palliative Medicine Team   NO CHARGE

## 2019-03-06 NOTE — Progress Notes (Signed)
Initial Nutrition Assessment  DOCUMENTATION CODES:   Not applicable  INTERVENTION:  -Hormel Shake (Vital Cuisine) with breakfast, each supplement provides 520 kcal and 22 grams of protein  -MVI with minerals daily  NUTRITION DIAGNOSIS:   Inadequate oral intake related to lethargy/confusion, acute illness(dementia; aspiration PNA) as evidenced by per patient/family report(husband reports patient with increased confusion and poor po intake; DYS1;Nectar Thick).  GOAL:   Patient will meet greater than or equal to 90% of their needs    MONITOR:   Labs, Weight trends, Supplement acceptance, I & O's, PO intake  REASON FOR ASSESSMENT:   Malnutrition Screening Tool    ASSESSMENT:  68 year old female with past medical history of dementia, HTN, urinary incontinence, restless leg syndrome and HLD who presented to ED last week with complaints of cough and shortness of breath s/p reportedly going to her beach house where she proceeded to eat a bunch of deodorant gel balls. Patient went to the ED that day, evaluated and sent home. She returned to ED 2 days later due to persistent cough at which time they identified possible PNA on CXR and started on Augmentin. Patient continued to remain weak and fatigued with poor po intake, per husband pt more confused than her baseline and PCP recommended returning to ED for further evaluation. Repeat CXR in ED shows possible pneumonia.  Unable to obtain nutrition history at this time, SLP in room for speech evaluation at RD visit. Diet advanced to DYS1 with nectar thick fluids this afternoon. Will continue to monitor for po intake of meals and will provide Hormel Shake with breakfast tray. Per chart review, patient awake and alert, but confused; able to follow simple commands and respond to yes/no questions. PMT discussed goals of care with husband via phone. Patient is at risk high risk of continued aspiration and pneumonia with progression of dementia.  Husband hopeful for the best, preparing for worst and wishes to treat the treatable and take it one day at a time. Confirms DNR/DNI and no artificial feedings/PEG.  Current wt 63.5 kg (139.7 lb) No recent wt history for review, last wt prior to admission 63.5 kg recorded in 2017  Medications reviewed and include: Unasyn, D5 @ 10 ml/hr, KCl 10 mEq in 100 ml IVPB Labs reviewed   NUTRITION - FOCUSED PHYSICAL EXAM: Deferred   Diet Order:   Diet Order            DIET - DYS 1 Room service appropriate? Yes with Assist; Fluid consistency: Nectar Thick  Diet effective now              EDUCATION NEEDS:   No education needs have been identified at this time  Skin:  Skin Assessment: Reviewed RN Assessment  Last BM:  PTA  Height:   Ht Readings from Last 1 Encounters:  03/05/19 5\' 3"  (1.6 m)    Weight:   Wt Readings from Last 1 Encounters:  03/05/19 63.5 kg    Ideal Body Weight:  52.7 kg  BMI:  Body mass index is 24.8 kg/m.  Estimated Nutritional Needs:   Kcal:  1600-1800  Protein:  80-90  Fluid:  >/= 1.5 L/day   Lajuan Lines, RD, LDN Clinical Nutrition Office [phone] After Hours/Weekend Pager: [phone]

## 2019-03-06 NOTE — Consult Note (Signed)
Pharmacy Antibiotic Note  Kelsey Mitchell is a 68 y.o. female admitted on 03/04/2019 with pneumonia.  Pharmacy has been consulted for Unasyn dosing. Patient remains NPO.   Day # 3 Unasyn   Plan: Unasyn 3 g q6H.   Height: 5\' 3"  (160 cm) Weight: 139 lb 15.9 oz (63.5 kg) IBW/kg (Calculated) : 52.4  Temp (24hrs), Avg:98.4 F (36.9 C), Min:98.4 F (36.9 C), Max:98.5 F (36.9 C)  Recent Labs  Lab 03/04/19 1252 03/04/19 1529 03/04/19 1752  03/05/19 0508  03/05/19 1252 03/05/19 1914 03/05/19 2250 03/06/19 0300 03/06/19 0926  WBC 10.9*  --   --   --  13.1*  --   --   --   --   --   --   CREATININE 1.01*  --   --    < > 0.80   < > 0.73 0.77 0.54 0.66 0.74  LATICACIDVEN 2.1* 2.2* 2.0*  --   --   --   --   --   --   --   --    < > = values in this interval not displayed.    Estimated Creatinine Clearance: 58.7 mL/min (by C-G formula based on SCr of 0.74 mg/dL).    No Known Allergies  Microbiology results: 12/2 BCx: NGTD 12/2 MRSA PCR: Negative  Thank you for allowing pharmacy to be a part of this patient's care.  Rowland Lathe 03/06/2019 10:49 AM

## 2019-03-07 LAB — BASIC METABOLIC PANEL
Anion gap: 7 (ref 5–15)
BUN: 12 mg/dL (ref 8–23)
CO2: 26 mmol/L (ref 22–32)
Calcium: 8.3 mg/dL — ABNORMAL LOW (ref 8.9–10.3)
Chloride: 111 mmol/L (ref 98–111)
Creatinine, Ser: 0.64 mg/dL (ref 0.44–1.00)
GFR calc Af Amer: >60 mL/min (ref >60)
GFR calc non Af Amer: >60 mL/min (ref >60)
Glucose, Bld: 94 mg/dL (ref 70–99)
Potassium: 3.5 mmol/L (ref 3.5–5.1)
Sodium: 144 mmol/L (ref 135–145)

## 2019-03-07 LAB — MAGNESIUM: Magnesium: 2.3 mg/dL (ref 1.7–2.4)

## 2019-03-07 MED ORDER — ONDANSETRON HCL 4 MG/2ML IJ SOLN: 4.0000 mg | Freq: Four times a day (QID) | INTRAMUSCULAR | Status: DC | PRN

## 2019-03-07 MED ORDER — TRAZODONE HCL 50 MG PO TABS
50.0000 mg | ORAL_TABLET | Freq: Every day | ORAL | Status: DC
  Administered 2019-03-07 – 2019-03-08 (×2): 50 mg via ORAL
  Filled 2019-03-07 (×2): qty 1

## 2019-03-07 MED ORDER — MIRABEGRON ER 50 MG PO TB24
50.0000 mg | ORAL_TABLET | Freq: Every day | ORAL | Status: DC
  Administered 2019-03-07 – 2019-03-08 (×2): 50 mg via ORAL
  Filled 2019-03-07 (×5): qty 1

## 2019-03-07 MED ORDER — GUAIFENESIN 100 MG/5ML PO SOLN
5.0000 mL | Freq: Four times a day (QID) | ORAL | Status: DC
  Administered 2019-03-07 – 2019-03-08 (×5): 100 mg via ORAL
  Filled 2019-03-07 (×20): qty 5

## 2019-03-07 MED ORDER — AMOXICILLIN-POT CLAVULANATE 400-57 MG/5ML PO SUSR
800.0000 mg | Freq: Two times a day (BID) | ORAL | Status: DC
  Administered 2019-03-07 – 2019-03-08 (×3): 800 mg via ORAL
  Filled 2019-03-07 (×5): qty 10

## 2019-03-07 MED ORDER — ACETAMINOPHEN 325 MG PO TABS: 650.0000 mg | ORAL_TABLET | Freq: Four times a day (QID) | ORAL | Status: DC | PRN

## 2019-03-07 NOTE — Consult Note (Signed)
PHARMACY CONSULT NOTE - FOLLOW UP  Pharmacy Consult for Electrolyte Monitoring and Replacement   Recent Labs: Potassium (mmol/L)  Date Value  03/07/2019 3.5   Magnesium (mg/dL)  Date Value  03/07/2019 2.3   Calcium (mg/dL)  Date Value  03/07/2019 8.3 (L)   Albumin (g/dL)  Date Value  03/04/2019 4.3   Sodium (mmol/L)  Date Value  03/07/2019 144     Assessment: Pharmacy has been consuilted to monitor/replenish electrolytes in this 68 yo female with cough/SOB/hypokalemia.   Goal of Therapy:  Electrolytes wnl's  Plan:  No additional electrolyte replacement today. Will check electrolytes with AM labs.   Will consider starting daily po KCL 45meq per home meds list when pt can swallow meds whole.  Oswald Hillock ,PharmD, BCPS Clinical Pharmacist 03/07/2019 9:20 AM

## 2019-03-07 NOTE — Progress Notes (Signed)
Patient oxygen level 89% on room air. Patient placed on 1L oxygen with saturation of 93%. NP Blount notified with no new orders.

## 2019-03-07 NOTE — Progress Notes (Signed)
Speech Language Pathology Treatment: Dysphagia  Patient Details Name: Kelsey Mitchell MRN: [id number] DOB: [birth year] Today's Date: 03/07/2019 Time: 2947-6546 SLP Time Calculation (min) (ACUTE ONLY): 55 min  Assessment / Plan / Recommendation Clinical Impression  Pt seen today for ongoing assessment of toleration of diet; education on aspiration precautions and feeding support/strategies. Pt continues to present w/ oropharyngeal phase dysphagia and is at increased risk for prandial aspiration w/ Pulmonary decline. Palliative Care has consulted Husband re: GOC; no PEG placement stated. Husband was given education on aspiration; dysphagia; consequences of dysphagia; verbal/tactile cues needed during po intake to encourage pt to swallow and clear mouth; oral care b/f and after po intake this session. Pt appeared alert/awake this morning; her Husband arrived later in session. Speech fairy clear, but Dysarthria remains. Husband the speech was "about like this at home" and described several other situation of Confusion/Dementia ongoing for several months now. Oral care given; pt has an open-mouth posture. Pt continues to present w/ oropharyngeal phase dysphagia w/ increased risk for aspiration thus Pulmonary impact/decline. Pt required Max cues for attending to po boluses d/t decreased Cognitive attention and awareness to swallowing this session. Pt exhibited significantly slow motor (oral) movements w/ reduced lingual/labial strength and closure during swallowing; often a slight open-mouth posture at rest and when po's were held orally. During po trials, pt exhibited a min weak Pharyngeal swallow; delayed for the amount of time the bolus was in the mouth, and there was mild velopharyngeal incompetency noted intermittently. Husband stated fluids had "come through her nose sometimes at home in the past year". TSP trials of Nectar liquids and Purees were slow, orally held, and required Max cues to encourage  swallowing and oral clearing. The mild congested coughing noted at baseline w/ exertion (b/f po's) occurred intermittently during the po trials but did not appear grossly increase w/ severity w/ po trials given. SLP gave thorough oral care before and after the trials to help clean/clear mouth. Noted less bleeding on lips and a coated tongue.  MD consulted re: pt's presentation today and the concern for prandial aspiration. With Husbands request and understanding of risk for aspiration per SLP and Palliative Care notes, recommend continue current Dysphagia 1 diet w/ Nectar liquids VIA TSP w/ strict aspiration precautions and feeding support -- careful monitoring of all bites/sips taken then swallowed/cleared orally. Rec. Frequent Rest Breaks to avoid fatigue w/ task. Frequent oral care for hygiene and stimulation of swallowing to help manage own saliva. Necessary Pills Crushed in Puree. STOP po intake if increased s/s of aspiration or discomfort occur and consult MD. ST services will f/u w/ pt's status; education. NSG updated.      HPI HPI: Pt is a 68 y.o. 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  The patient had an episode last Wednesday while at the beach.  She reportedly has dementia and accidentally got into and was witnessed to be eating deodorant/detergent gel balls at her beach house.  She was foaming at the mouth at that time.  She was seen in the ED on Wednesday with normal chest x-ray and sent home.  She went back on Friday and was started on Augmentin for possible aspiration pneumonia.  Since then, she has had progressively worsening weakness, cough, shortness of breath, and decreased level of consciousness.  She essentially is no longer eating or drinking.  She has been unable to take the Augmentin due to difficulty swallowing.  She has been increasingly  drowsy.  She has been less responsive.  Her husband called her PCP who told her to  come to the ED for evaluation. In the Ed, pt lying in bed in ED. Verbally responsive to few basic questions but not consistently. Noted some Echolalia, Slow responses w/ min Dysarthria and a wet vocal quality noted. Mild congested coughing baseline.        SLP Plan  Continue with current plan of care       Recommendations  Diet recommendations: Dysphagia 1 (puree);Nectar-thick liquid Liquids provided via: Teaspoon Medication Administration: Crushed with puree(if a necessary med) Supervision: Staff to assist with self feeding;Full supervision/cueing for compensatory strategies Compensations: Minimize environmental distractions;Slow rate;Small sips/bites;Lingual sweep for clearance of pocketing;Multiple dry swallows after each bite/sip;Follow solids with liquid(check for a swallow then oral clearing each bolus) Postural Changes and/or Swallow Maneuvers: Seated upright 90 degrees;Upright 30-60 min after meal                General recommendations: (Palliative Care f/u; Dietician) Oral Care Recommendations: Oral care BID;Oral care before and after PO;Staff/trained caregiver to provide oral care Follow up Recommendations: 24 hour supervision/assistance(Hospice services at home) SLP Visit Diagnosis: Dysphagia, oropharyngeal phase (R13.12)(Cognitive decline/Dementia baseline) Plan: Continue with current plan of care       [place], [place], CCC-SLP , 03/07/2019, 1:05 PM

## 2019-03-07 NOTE — Progress Notes (Signed)
[facility] Progress Note  Patient: Kelsey Mitchell ZOX:[id number]   PCP: [place] DOB: [birth year]   DOA: 03/04/2019   DOS: 03/07/2019   Date of Service: the patient was seen and examined on 03/07/2019  Chief Complaint  Patient presents with  . possbile pneumonia   Brief hospital course: Kelsey ParmaDonna Grennan is a 68 y.o. female with Past medical history of dementia, HTN, urinary incontinence, restless leg syndrome, HLD. Patient presented with complaints of cough and shortness of breath. Reportedly patient went to her beach house where she ate a bunch of deodorant gel balls.  She had some foaming from the mouth at that time and had some cough. She went to ED that day in 3 hours and with normal chest x-ray she was sent home. She went to ED again 2 days later due to persistent cough at which time they identified a possible pneumonia on the chest x-ray and patient was started on Augmentin. Patient continues to remain weak and fatigued and tired.  Has poor p.o. intake.  No nausea or vomiting.  Patient was also more confused than her baseline and therefore husband called the PCP and patient was informed that she should come to the ER for further evaluation. At the time of my evaluation patient denies any complaints of chest pain abdominal pain.  No nausea no vomiting. Patient does have difficulty swallowing at her baseline.  Currently further plan is continue antibiotics.  Subjective: no significant change in mental status.No nausea no vomiting no fever no chills. Has cough no diarrhea.   Assessment and Plan: Scheduled Meds: . amoxicillin-clavulanate  800 mg Oral Q12H  . enoxaparin (LOVENOX) injection  40 mg Subcutaneous Q24H  . mirabegron ER  50 mg Oral Daily  . rOPINIRole  3 mg Oral QHS  . sodium chloride flush  3 mL Intravenous Q12H  . traZODone  50 mg Oral QHS   Continuous Infusions: . sodium chloride Stopped (03/07/19 1155)  . dextrose 10 mL/hr at 03/07/19 1549   PRN  Meds: sodium chloride, sodium chloride flush  1. Aspiration pneumonia Speech therapy consulted. Appreciate assistance. Currently on dysphagia 1 diet. Treated with IV Unasyn. Change to Augmentin. No growth on cultures. Chest vest as the patient appears to have poor cough.  2. Dementia. Restless leg syndrome. Continue home medication for now. Palliative care consult for goals of care discussion.   Appreciate assistance.  3. Hypernatremia. Resolved  From poor p.o. intake. Patient treated with  D5 half-normal saline and then d5 only Now resolved and we stopped the fluids.   4. Urinary incontinence. Patient is on Myrbetriq. We will resume  5. Severe Dysphagia. Speech therapy consulted. MRI brain negative for any acute stroke. Do not think that deodorizer and beads are actually causing patient's dysphagia.  6. Goals of care discussion: DNR/DNI-as confirmed by husband. Continue to treat the treatable. Husband remains hopeful for some improvement and patient can return home with caregiver support.  NO ARTIFICIAL FEEDINGS/PEG  Husband requests providers not discuss PEG or tubefeedings around caregivers or children, as he states he is not sure his children would understand his rationale although he would hope they would feel the same way. Outpatient Palliative at request of husband  Diet: Dysphagia 1 diet DVT Prophylaxis: Subcutaneous Lovenox   Advance goals of care discussion: DNR  Family Communication: no family was present at bedside, at the time of interview.  Caregiver was present at the bedside, the pt provided permission to discuss medical plan with the family.  Opportunity was given to ask question and all questions were answered satisfactorily.   Disposition:  Discharge to be determined.  Consultants: none Procedures: none  Antibiotics: Anti-infectives (From admission, onward)   Start     Dose/Rate Route Frequency Ordered Stop   03/04/19 2000   Ampicillin-Sulbactam (UNASYN) 3 g in sodium chloride 0.9 % 100 mL IVPB     3 g 200 mL/hr over 30 Minutes Intravenous Every 6 hours 03/04/19 1903     03/04/19 1400  piperacillin-tazobactam (ZOSYN) IVPB 3.375 g     3.375 g 12.5 mL/hr over 240 Minutes Intravenous Once 03/04/19 1335 03/04/19 1452      Objective: Physical Exam: Vitals:   03/06/19 1632 03/06/19 2316 03/06/19 2351 03/07/19 0825  BP: (!) 151/93 114/74  118/71  Pulse: 75 73  69  Resp: 14 18  16   Temp: 98.6 F (37 C) 98.9 F (37.2 C)  (!) 97.4 F (36.3 C)  TempSrc:  Oral  Oral  SpO2: 93% (!) 89% 93% 95%  Weight:      Height:        Intake/Output Summary (Last 24 hours) at 03/07/2019 1549 Last data filed at 03/07/2019 1500 Gross per 24 hour  Intake 584.43 ml  Output 800 ml  Net -215.57 ml   Filed Weights   03/04/19 1241 03/05/19 1743  Weight: 63.5 kg 63.5 kg   General: alert and oriented to place and person. Appear in moderate distress, affect flat in affect Eyes: PERRL, Conjunctiva normal ENT: Oral Mucosa Clear, moist  Neck: difficult to assess  JVD, no Abnormal Mass Or lumps Cardiovascular: S1 and S2 Present, no Murmur,  Respiratory: increased respiratory effort, Bilateral Air entry equal and Decreased, no signs of accessory muscle use, bilateral upper airway crackles, no wheezes Abdomen: Bowel Sound present, Soft and no tenderness, no hernia Skin: no rashes  Extremities: no Pedal edema, no calf tenderness Neurologic: without any new focal findings Gait not checked due to patient safety concerns  Data Reviewed: I have personally reviewed and interpreted daily labs, tele strips, imagings as discussed above. I reviewed all nursing notes, pharmacy notes, vitals, pertinent old records I have discussed plan of care as described above with RN and patient/family.  CBC: Recent Labs  Lab 03/04/19 1252 03/05/19 0508  WBC 10.9* 13.1*  NEUTROABS 9.1* 11.0*  HGB 16.4* 13.6  HCT 52.3* 43.5  MCV 98.9 98.9  PLT  288 194   Basic Metabolic Panel: Recent Labs  Lab 03/05/19 0508  03/05/19 1914 03/05/19 2250 03/06/19 0300 03/06/19 0926 03/07/19 0414  NA 151*   < > 148* 149* 145 145 144  K 3.0*   < > 3.3* 3.1* 3.2* 3.8 3.5  CL 115*   < > 113* 115* 112* 109 111  CO2 29   < > 27 27 28 27 26   GLUCOSE 126*   < > 97 113* 100* 97 94  BUN 21   < > 14 13 13 11 12   CREATININE 0.80   < > 0.77 0.54 0.66 0.74 0.64  CALCIUM 8.2*   < > 8.4* 8.3* 8.3* 8.6* 8.3*  MG 2.3  --   --   --   --   --  2.3   < > = values in this interval not displayed.    Liver Function Tests: Recent Labs  Lab 03/04/19 1252  AST 37  ALT 35  ALKPHOS 84  BILITOT 0.8  PROT 9.0*  ALBUMIN 4.3   No results for input(s): LIPASE,  AMYLASE in the last 168 hours. No results for input(s): AMMONIA in the last 168 hours. Coagulation Profile: No results for input(s): INR, PROTIME in the last 168 hours. Cardiac Enzymes: No results for input(s): CKTOTAL, CKMB, CKMBINDEX, TROPONINI in the last 168 hours. BNP (last 3 results) No results for input(s): PROBNP in the last 8760 hours. CBG: Recent Labs  Lab 03/04/19 2050  GLUCAP 99   Studies: No results found.   Time spent: 35 minutes  Author: Berle Mull, MD [facility] 03/07/2019 3:49 PM  To reach On-call, see care teams to locate the attending and reach out to them via [email or web address]. If 7PM-7AM, please contact night-coverage If you still have difficulty reaching the attending provider, please page the [facility] (Director on Call) for [facility] on amion for assistance.

## 2019-03-08 NOTE — Progress Notes (Signed)
Pt with poor PO intake. I tried to feed her today, but she either get the food out of her mouth or hold it in her mouth. Pt only took her meds today.  Dr Posey Pronto made aware today of the above.

## 2019-03-08 NOTE — Progress Notes (Signed)
PT Cancellation Note  Patient Details Name: Kelsey Mitchell MRN: [id number] DOB: 1950/07/10   Cancelled Treatment:    Reason Eval/Treat Not Completed: Other (comment)(Duplicate PT order, as confirmed with Dr. Posey Pronto. Pt was evaluated by PT on 12/3 with no further PT follow up recommended.)   Madilyn Hook 03/08/2019, 9:42 AM

## 2019-03-08 NOTE — Consult Note (Signed)
PHARMACY CONSULT NOTE - FOLLOW UP  Pharmacy Consult for Electrolyte Monitoring and Replacement   Recent Labs: Potassium (mmol/L)  Date Value  03/07/2019 3.5   Magnesium (mg/dL)  Date Value  03/07/2019 2.3   Calcium (mg/dL)  Date Value  03/07/2019 8.3 (L)   Albumin (g/dL)  Date Value  03/04/2019 4.3   Sodium (mmol/L)  Date Value  03/07/2019 144     Assessment: Pharmacy has been consuilted to monitor/replenish electrolytes in this 68 yo female with cough/SOB/hypokalemia.   Goal of Therapy:  Electrolytes wnl's  Plan:  No additional electrolyte replacement today. Will check electrolytes with AM labs.   Will consider starting daily po KCL 56meq per home meds list when pt can swallow meds whole.  Oswald Hillock ,PharmD, BCPS Clinical Pharmacist 03/08/2019 8:24 AM

## 2019-03-08 NOTE — Progress Notes (Signed)
[facility] Progress Note  Patient: Kelsey Mitchell FAO:[id number]   PCP: [facility] DOB: 03/10/1951   DOA: 03/04/2019   DOS: 03/08/2019   Date of Service: the patient was seen and examined on 03/08/2019  Chief Complaint  Patient presents with  . Kelsey Mitchell pneumonia   Brief hospital course: [name] is a 68 y.o. female with Past medical history of dementia, HTN, urinary incontinence, restless leg syndrome, HLD. Patient presented with complaints of cough and shortness of breath. Reportedly patient went to her beach house where she ate a bunch of deodorant gel balls.  She had some foaming from the mouth at that time and had some cough. She went to ED that day in 3 hours and with normal chest x-ray she was sent home. She went to ED again 2 days later due to persistent cough at which time they identified a possible pneumonia on the chest x-ray and patient was started on Augmentin. Patient continues to remain weak and fatigued and tired.  Has poor p.o. intake.  No nausea or vomiting.  Patient was also more confused than her baseline and therefore husband called the PCP and patient was informed that she should come to the ER for further evaluation. At the time of my evaluation patient denies any complaints of chest pain abdominal pain.  No nausea no vomiting. Patient does have difficulty swallowing at her baseline.  Currently further plan is continue antibiotics.  Subjective: no significant change in mental status.No nausea no vomiting no fever no chills. Has cough no diarrhea.   Assessment and Plan: Scheduled Meds: . amoxicillin-clavulanate  800 mg Oral Q12H  . enoxaparin (LOVENOX) injection  40 mg Subcutaneous Q24H  . guaiFENesin  5 mL Oral QID  . mirabegron ER  50 mg Oral Daily  . rOPINIRole  3 mg Oral QHS  . sodium chloride flush  3 mL Intravenous Q12H  . traZODone  50 mg Oral QHS   Continuous Infusions: . sodium chloride Stopped (03/07/19 1155)   PRN Meds:  sodium chloride, acetaminophen, ondansetron (ZOFRAN) IV, sodium chloride flush  1. Aspiration pneumonia Speech therapy consulted. Appreciate assistance. Currently on dysphagia 1 diet. Treated with IV Unasyn. Change to Augmentin. No growth on cultures. Chest vest as the patient appears to have poor cough. Today significant improvement in patient's respiratory symptoms. Monitor overnight likely for discharge tomorrow.  2. Dementia. Restless leg syndrome. Continue home medication for now. Palliative care consult for goals of care discussion.   Appreciate assistance.  3. Hypernatremia. Resolved  From poor p.o. intake. Patient treated with  D5 half-normal saline and then d5 only Now resolved and we stopped the fluids.   4. Urinary incontinence. Patient is on Myrbetriq. We will resume  5. Severe Dysphagia. Speech therapy consulted. MRI brain negative for any acute stroke. Do not think that deodorizer and beads are actually causing patient's dysphagia.  6. Goals of care discussion: DNR/DNI-as confirmed by husband. Continue to treat the treatable. Husband remains hopeful for some improvement and patient can return home with caregiver support.  NO ARTIFICIAL FEEDINGS/PEG  Husband requests providers not discuss PEG or tubefeedings around caregivers or children, as he states he is not sure his children would understand his rationale although he would hope they would feel the same way. Outpatient Palliative at request of husband  Diet: Dysphagia 1 diet DVT Prophylaxis: Subcutaneous Lovenox   Advance goals of care discussion: DNR  Family Communication: no family was present at bedside, at the time of interview.  Caregiver was present at the bedside, the pt provided permission to discuss medical plan with the family. Opportunity was given to ask question and all questions were answered satisfactorily.   Disposition:  Discharge to home with palliative care.  Consultants:  Palliative care Procedures: none  Antibiotics: Anti-infectives (From admission, onward)   Start     Dose/Rate Route Frequency Ordered Stop   03/07/19 2200  amoxicillin-clavulanate (AUGMENTIN) 400-57 MG/5ML suspension 800 mg     800 mg Oral Every 12 hours 03/07/19 1551     03/04/19 2000  Ampicillin-Sulbactam (UNASYN) 3 g in sodium chloride 0.9 % 100 mL IVPB  Status:  Discontinued     3 g 200 mL/hr over 30 Minutes Intravenous Every 6 hours 03/04/19 1903 03/07/19 1551   03/04/19 1400  piperacillin-tazobactam (ZOSYN) IVPB 3.375 g     3.375 g 12.5 mL/hr over 240 Minutes Intravenous Once 03/04/19 1335 03/04/19 1452      Objective: Physical Exam: Vitals:   03/07/19 0825 03/07/19 1626 03/08/19 0013 03/08/19 0757  BP: 118/71 131/87 115/80 139/73  Pulse: 69 67 75 64  Resp: 16  19 16   Temp: (!) 97.4 F (36.3 C) 98 F (36.7 C) 98.5 F (36.9 C) 97.7 F (36.5 C)  TempSrc: Oral  Axillary Oral  SpO2: 95% 97% 94% 99%  Weight:      Height:        Intake/Output Summary (Last 24 hours) at 03/08/2019 1427 Last data filed at 03/08/2019 1313 Gross per 24 hour  Intake 900.92 ml  Output 800 ml  Net 100.92 ml   Filed Weights   03/04/19 1241 03/05/19 1743  Weight: 63.5 kg 63.5 kg   General: alert and oriented to place and person. Appear in moderate distress, affect flat in affect Eyes: PERRL, Conjunctiva normal ENT: Oral Mucosa Clear, moist  Neck: difficult to assess  JVD, no Abnormal Mass Or lumps Cardiovascular: S1 and S2 Present, no Murmur,  Respiratory: increased respiratory effort, Bilateral Air entry equal and Decreased, no signs of accessory muscle use, bilateral upper airway crackles, no wheezes Abdomen: Bowel Sound present, Soft and no tenderness, no hernia Skin: no rashes  Extremities: no Pedal edema, no calf tenderness Neurologic: without any new focal findings Gait not checked due to patient safety concerns  Data Reviewed: I have personally reviewed and interpreted daily  labs, tele strips, imagings as discussed above. I reviewed all nursing notes, pharmacy notes, vitals, pertinent old records I have discussed plan of care as described above with RN and patient/family.  CBC: Recent Labs  Lab 03/04/19 1252 03/05/19 0508  WBC 10.9* 13.1*  NEUTROABS 9.1* 11.0*  HGB 16.4* 13.6  HCT 52.3* 43.5  MCV 98.9 98.9  PLT 288 194   Basic Metabolic Panel: Recent Labs  Lab 03/05/19 0508  03/05/19 1914 03/05/19 2250 03/06/19 0300 03/06/19 0926 03/07/19 0414  NA 151*   < > 148* 149* 145 145 144  K 3.0*   < > 3.3* 3.1* 3.2* 3.8 3.5  CL 115*   < > 113* 115* 112* 109 111  CO2 29   < > 27 27 28 27 26   GLUCOSE 126*   < > 97 113* 100* 97 94  BUN 21   < > 14 13 13 11 12   CREATININE 0.80   < > 0.77 0.54 0.66 0.74 0.64  CALCIUM 8.2*   < > 8.4* 8.3* 8.3* 8.6* 8.3*  MG 2.3  --   --   --   --   --  2.3   < > = values in this interval not displayed.    Liver Function Tests: Recent Labs  Lab 03/04/19 1252  AST 37  ALT 35  ALKPHOS 84  BILITOT 0.8  PROT 9.0*  ALBUMIN 4.3   No results for input(s): LIPASE, AMYLASE in the last 168 hours. No results for input(s): AMMONIA in the last 168 hours. Coagulation Profile: No results for input(s): INR, PROTIME in the last 168 hours. Cardiac Enzymes: No results for input(s): CKTOTAL, CKMB, CKMBINDEX, TROPONINI in the last 168 hours. BNP (last 3 results) No results for input(s): PROBNP in the last 8760 hours. CBG: Recent Labs  Lab 03/04/19 2050  GLUCAP 99   Studies: No results found.   Time spent: 35 minutes  Author: Lynden Oxford, MD [facility] 03/08/2019 2:27 PM  To reach On-call, see care teams to locate the attending and reach out to them via [email or web address]. If 7PM-7AM, please contact night-coverage If you still have difficulty reaching the attending provider, please page the [facility] (Director on Call) for [facility] on amion for assistance.

## 2019-03-09 LAB — CULTURE, BLOOD (ROUTINE X 2)
Culture: NO GROWTH
Culture: NO GROWTH
Special Requests: ADEQUATE
Special Requests: ADEQUATE

## 2019-03-09 LAB — BASIC METABOLIC PANEL
Anion gap: 9 (ref 5–15)
BUN: 19 mg/dL (ref 8–23)
CO2: 26 mmol/L (ref 22–32)
Calcium: 8.7 mg/dL — ABNORMAL LOW (ref 8.9–10.3)
Chloride: 105 mmol/L (ref 98–111)
Creatinine, Ser: 0.6 mg/dL (ref 0.44–1.00)
GFR calc Af Amer: >60 mL/min (ref >60)
GFR calc non Af Amer: >60 mL/min (ref >60)
Glucose, Bld: 115 mg/dL — ABNORMAL HIGH (ref 70–99)
Potassium: 3.5 mmol/L (ref 3.5–5.1)
Sodium: 140 mmol/L (ref 135–145)

## 2019-03-09 LAB — MAGNESIUM: Magnesium: 2.4 mg/dL (ref 1.7–2.4)

## 2019-03-09 MED ORDER — BIOTENE DRY MOUTH MT LIQD: 15.0000 mL | OROMUCOSAL | Status: DC | PRN

## 2019-03-09 MED ORDER — GLYCOPYRROLATE 0.2 MG/ML IJ SOLN
0.2000 mg | INTRAMUSCULAR | Status: DC | PRN
  Filled 2019-03-09: qty 1

## 2019-03-09 MED ORDER — ACETAMINOPHEN 650 MG RE SUPP: 650.0000 mg | Freq: Four times a day (QID) | RECTAL | Status: DC | PRN

## 2019-03-09 MED ORDER — ONDANSETRON 4 MG PO TBDP
4.0000 mg | ORAL_TABLET | Freq: Four times a day (QID) | ORAL | Status: DC | PRN
  Filled 2019-03-09: qty 1

## 2019-03-09 MED ORDER — HALOPERIDOL LACTATE 5 MG/ML IJ SOLN: 0.5000 mg | INTRAMUSCULAR | Status: DC | PRN

## 2019-03-09 MED ORDER — SODIUM CHLORIDE 0.9 % IV SOLN: 250.0000 mL | INTRAVENOUS | Status: DC | PRN

## 2019-03-09 MED ORDER — GLYCOPYRROLATE 1 MG PO TABS
1.0000 mg | ORAL_TABLET | ORAL | Status: DC | PRN
  Filled 2019-03-09: qty 1

## 2019-03-09 MED ORDER — LORAZEPAM 2 MG/ML IJ SOLN: 0.5000 mg | INTRAMUSCULAR | Status: DC | PRN

## 2019-03-09 MED ORDER — ONDANSETRON HCL 4 MG/2ML IJ SOLN: 4.0000 mg | Freq: Four times a day (QID) | INTRAMUSCULAR | Status: DC | PRN

## 2019-03-09 MED ORDER — LORAZEPAM 2 MG/ML PO CONC
1.0000 mg | ORAL | Status: DC | PRN
  Filled 2019-03-09: qty 0.5

## 2019-03-09 MED ORDER — SODIUM CHLORIDE 0.9% FLUSH
3.0000 mL | Freq: Two times a day (BID) | INTRAVENOUS | Status: DC
  Administered 2019-03-09 – 2019-03-11 (×3): 3 mL via INTRAVENOUS

## 2019-03-09 MED ORDER — LORAZEPAM 1 MG PO TABS: 1.0000 mg | ORAL_TABLET | ORAL | Status: DC | PRN

## 2019-03-09 MED ORDER — MORPHINE SULFATE (PF) 2 MG/ML IV SOLN: 1.0000 mg | INTRAVENOUS | Status: DC | PRN

## 2019-03-09 MED ORDER — HALOPERIDOL 0.5 MG PO TABS
0.5000 mg | ORAL_TABLET | ORAL | Status: DC | PRN
  Filled 2019-03-09: qty 1

## 2019-03-09 MED ORDER — SODIUM CHLORIDE 0.9% FLUSH: 3.0000 mL | INTRAVENOUS | Status: DC | PRN

## 2019-03-09 MED ORDER — POLYVINYL ALCOHOL 1.4 % OP SOLN
1.0000 [drp] | Freq: Four times a day (QID) | OPHTHALMIC | Status: DC | PRN
  Filled 2019-03-09: qty 15

## 2019-03-09 MED ORDER — MORPHINE SULFATE (CONCENTRATE) 10 MG/0.5ML PO SOLN
2.5000 mg | ORAL | Status: DC | PRN
  Administered 2019-03-10: 5 mg via ORAL
  Filled 2019-03-09: qty 1

## 2019-03-09 MED ORDER — HALOPERIDOL LACTATE 2 MG/ML PO CONC
0.5000 mg | ORAL | Status: DC | PRN
  Filled 2019-03-09: qty 0.3

## 2019-03-09 MED ORDER — ACETAMINOPHEN 325 MG PO TABS: 650.0000 mg | ORAL_TABLET | Freq: Four times a day (QID) | ORAL | Status: DC | PRN

## 2019-03-09 NOTE — Progress Notes (Addendum)
Daily Progress Note   Patient Name: Kelsey Mitchell       Date: 03/09/2019 DOB: 03/05/1951  Age: 68 y.o. MRN#: [id number] Attending Physician: Lavina Hamman, MD Primary Care Physician: Angelene Giovanni Primary Care Admit Date: 03/04/2019  Reason for Consultation/Follow-up: Establishing goals of care  Subjective: Patient is resting in bed. She opens eyes to touch, but does not speak and is lethargic. No family at bedside. Per staff, she has not been eating or drinking. She currently has a dysphagia diet with nectar thick liquids by teaspoon.   Spoke with husband Mortimer Fries. Mortimer Fries states they have 2 children together but does not want me to speak with the children. We discussed her diagnoses, prognosis, GOC, EOL wishes disposition and options.  A detailed discussion was had today regarding advanced directives.  Concepts specific to code status, artifical feeding and hydration, IV antibiotics and rehospitalization were discussed.  The difference between an aggressive medical intervention path and a comfort care path was discussed.  Values and goals of care important to patient and family were attempted to be elicited.  Discussed limitations of medical interventions to prolong quality of life in some situations and discussed the concept of human mortality. She is a woman of faith as is her husband. The natural trajectory and expectations at EOL were discussed.     He states he knew this call would come. He states he does not want her to suffer. He does not want to lose her, but does not want to keep her here on earth as she is now.  He understands she would likely remove a feeding tube and knows she would not want one. He understands her dementia is progressive. He would like to shift to comfort care and  move her to hospice facility.   ADDENDUM: Husband and paid caregiver to bedside shortly after phone conversation. He states he is comfortable with a decision for shifting to comfort care. He states her swallowing has been declining for a while, and over the past few weeks, she could not form her lips to spit out toothpaste/water after brushing her teeth. He called his son Vicente Serene, we discussed her status per patient's husband request.   I completed a MOST form today with Mortimer Fries, patient's husband, and the signed original was placed in the chart. A photocopy was placed  in the chart to be scanned into EMR. The patient outlined their wishes for the following treatment decisions:  Cardiopulmonary Resuscitation: Do Not Attempt Resuscitation (DNR/No CPR)  Medical Interventions: Comfort Measures: Keep clean, warm, and dry. Use medication by any route, positioning, wound care, and other measures to relieve pain and suffering. Use oxygen, suction and manual treatment of airway obstruction as needed for comfort. Do not transfer to the hospital unless comfort needs cannot be met in current location.  Antibiotics: No antibiotics (use other measures to relieve symptoms)  IV Fluids: No IV fluids (provide other measures to ensure comfort)  Feeding Tube: No feeding tube     Length of Stay: 5  Current Medications: Scheduled Meds:  . amoxicillin-clavulanate  800 mg Oral Q12H  . enoxaparin (LOVENOX) injection  40 mg Subcutaneous Q24H  . guaiFENesin  5 mL Oral QID  . mirabegron ER  50 mg Oral Daily  . rOPINIRole  3 mg Oral QHS  . sodium chloride flush  3 mL Intravenous Q12H  . traZODone  50 mg Oral QHS    Continuous Infusions: . sodium chloride Stopped (03/07/19 1155)    PRN Meds: sodium chloride, acetaminophen, ondansetron (ZOFRAN) IV, sodium chloride flush  Physical Exam Constitutional:      Comments: Opens eyes to touch.   Skin:    General: Skin is warm and dry.  Neurological:     Comments: Does  not speak.              Vital Signs: BP 106/63 (BP Location: Left Arm)   Pulse 94   Temp 99.4 F (37.4 C) (Axillary)   Resp 16   Ht '5\' 3"'  (1.6 m)   Wt 63.5 kg   SpO2 94%   BMI 24.80 kg/m  SpO2: SpO2: 94 % O2 Device: O2 Device: Nasal Cannula O2 Flow Rate: O2 Flow Rate (L/min): 4 L/min  Intake/output summary:   Intake/Output Summary (Last 24 hours) at 03/09/2019 1222 Last data filed at 03/09/2019 0900 Gross per 24 hour  Intake 3 ml  Output 200 ml  Net -197 ml   LBM: Last BM Date: (unknown) Baseline Weight: Weight: 63.5 kg Most recent weight: Weight: 63.5 kg       Palliative Assessment/Data: 10%      Patient Active Problem List   Diagnosis Date Noted  . Aspiration pneumonia ([place]) 03/04/2019    Palliative Care Assessment & Plan    Recommendations/Plan:  Recommend hospice facility placement. Husband is in agreement.    Code Status:    Code Status Orders  (From admission, onward)         Start     Ordered   03/04/19 1835  Do not attempt resuscitation (DNR)  Continuous    Question Answer Comment  In the event of cardiac or respiratory ARREST Do not call a "code blue"   In the event of cardiac or respiratory ARREST Do not perform Intubation, CPR, defibrillation or ACLS   In the event of cardiac or respiratory ARREST Use medication by any route, position, wound care, and other measures to relive pain and suffering. May use oxygen, suction and manual treatment of airway obstruction as needed for comfort.      03/04/19 1838        Code Status History    This patient has a current code status but no historical code status.   Advance Care Planning Activity    Advance Directive Documentation     Most Recent Value  Type of  Advance Directive  Living will, Healthcare Power of Attorney  Pre-existing out of facility DNR order (yellow form or pink MOST form)  -  "MOST" Form in Place?  -       Prognosis:   < 2 weeks Bites and sips at most. Advanced  dementia.     Care plan was discussed with primary MD and order placed.   Thank you for allowing the Palliative Medicine Team to assist in the care of this patient.   Time In: 11:50 1:40 Time Out: 12:30 2:10 Total Time 40 min 30 min 70 min total Prolonged Time Billed no      Greater than 50%  of this time was spent counseling and coordinating care related to the above assessment and plan.  Asencion Gowda, NP  Please contact Palliative Medicine Team phone at [phone] for questions and concerns.

## 2019-03-09 NOTE — Progress Notes (Signed)
Patients " Caretaker " at bedside putting ice cream into patients mouth , educated that patient had not been swallowing , caregiver proceeded to say patient would swallow after about 20 or so minutes". Patient not swallowing while this writer in room with patient and cargiver holding ice cream in her mouth and mouth open

## 2019-03-09 NOTE — Progress Notes (Signed)
Attempt to give 1 vial of antibiotics patient lets run out of side of mouth , oral care provided. O2 was 88% on 2l , on 4l it is 96% . Will notify Dr. Posey Pronto. Patient IV to RUA is infiltrated. Turned to right side, patient remains nonverbal only able to use nonverbal indicators.

## 2019-03-09 NOTE — Progress Notes (Signed)
RT " Bambi " removed copious amount of blood and sputum from patients lungs d/t aspiration of liquids , meds etc. Dr. Posey Pronto made aware, Patient will be hospice and will continue to offer fluids and nutrition as tolerated .

## 2019-03-09 NOTE — Care Management Important Message (Signed)
Important Message  Patient Details  Name: Kelsey Mitchell MRN: [id number] Date of Birth: 11/20/1950   Medicare Important Message Given:  Yes     [name] 03/09/2019, 11:46 AM

## 2019-03-09 NOTE — Progress Notes (Signed)
New referral for [facility] hospice home received from [facility]. Writer met in the room with patient's husband Mortimer Fries and caregiver [name] to initiate education regarding hospice services, philosophy, team approach to care and current visitation policy with understanding voiced. Questions answered. Contact information given to [facility]. Patient was alert at the beginning of the visit, with audible secretions noted. Discussed the risk of putting food in her mouth with both husband and caregiver, both voiced understanding that it is now unsafe to feed patient. Patient appeared to be sleeping at end of visit. Patient information faxed to referral. Possible discharge to the hospice home tomorrow 12/8, pending hospice medical director approval and consents. CMRN Delil[place] updated. Will continue to follow through discharge. Flo Shanks BSN, RN, [place] [phone]

## 2019-03-09 NOTE — Progress Notes (Signed)
SLP Note  Patient Details Name: Brookelynn Hamor MRN: [id number] DOB: Jan 23, 1951   Cancelled treatment:       Reason Eval/Treat Not Completed: Patient not medically ready(chart reviewed; consulted NSG). Per NSG notes, pt is having a harder time orally managing po intake and an equally difficult time engaging the oropharyngeal muscles for initiate/complete a swallow. PO meds were attempted this AM w/ oral holding and anterior spillage; no noticeable swallowing. Decline in her swallowing from the initial BSE was noted yesterday as well per NSG notes. As pt appears further declined in swallowing abilities, in addition to her baseline dysphagia d/t Cognitive decline, this SLP recommends NPO status be considered d/t high risk for aspiration and Pulmonary decline. Will discuss w/ MD, NSG, and Palliative Care. Recommend frequent oral care for hygiene and stimulation of swallowing.     Orinda Kenner, MS, CCC-SLP Watson,[name] 03/09/2019, 11:13 AM

## 2019-03-09 NOTE — Progress Notes (Signed)
[facility] Progress Note  Patient: Kelsey Mitchell WVP:[id number]   PCP: Duard [facility] DOB: [birth year]   DOA: 03/04/2019   DOS: 03/09/2019   Date of Service: the patient was seen and examined on 03/09/2019  Chief Complaint  Patient presents with  . possbile pneumonia   Brief hospital course: Kelsey Mitchell is a 68 y.o. female with Past medical history of dementia, HTN, urinary incontinence, restless leg syndrome, HLD. Patient presented with complaints of cough and shortness of breath. Reportedly patient went to her beach house where she ate a bunch of deodorant gel balls.  She had some foaming from the mouth at that time and had some cough. She went to ED that day in 3 hours and with normal chest x-ray she was sent home. She went to ED again 2 days later due to persistent cough at which time they identified a possible pneumonia on the chest x-ray and patient was started on Augmentin. Patient continues to remain weak and fatigued and tired.  Has poor p.o. intake.  No nausea or vomiting.  Patient was also more confused than her baseline and therefore husband called the PCP and patient was informed that she should come to the ER for further evaluation. At the time of my evaluation patient denies any complaints of chest pain abdominal pain.  No nausea no vomiting. Patient does have difficulty swallowing at her baseline.  Currently further plan is continue comfort care.  Subjective: Minimal oral intake.  Not following commands.  No nausea no vomiting.  Not taking pills.  Assessment and Plan: Scheduled Meds: . guaiFENesin  5 mL Oral QID  . mirabegron ER  50 mg Oral Daily  . rOPINIRole  3 mg Oral QHS  . sodium chloride flush  3 mL Intravenous Q12H  . sodium chloride flush  3 mL Intravenous Q12H  . traZODone  50 mg Oral QHS   Continuous Infusions: . sodium chloride Stopped (03/07/19 1155)  . sodium chloride     PRN Meds: sodium chloride, sodium chloride, acetaminophen  **OR** acetaminophen, antiseptic oral rinse, glycopyrrolate **OR** glycopyrrolate **OR** glycopyrrolate, haloperidol **OR** haloperidol **OR** haloperidol lactate, LORazepam **OR** LORazepam **OR** LORazepam, morphine injection, morphine CONCENTRATE, ondansetron **OR** ondansetron (ZOFRAN) IV, polyvinyl alcohol, sodium chloride flush, sodium chloride flush  1. Aspiration pneumonia Speech therapy consulted. Appreciate assistance. Currently on dysphagia 1 diet. Treated with IV Unasyn. Change to Augmentin. No growth on cultures. Chest vest as the patient appears to have poor cough. Today patient continues to have poor p.o. intake as well as aspiration precaution Discussed with husband who is at bedside and the goal is to transition her to complete comfort. May even require residential hospice. Monitor care.  2. Dementia. Restless leg syndrome. Continue home medication for now. Palliative care consult for goals of care discussion.   Appreciate assistance.  3. Hypernatremia. Resolved  From poor p.o. intake. Patient treated with  D5 half-normal saline and then d5 only Now resolved and we stopped the fluids.   4. Urinary incontinence. Patient is on Myrbetriq. We will resume  5. Severe Dysphagia. Speech therapy consulted. MRI brain negative for any acute stroke. Do not think that deodorizer and beads are actually causing patient's dysphagia.  6. Goals of care discussion: DNR/DNI-as confirmed by husband. Transition to complete comfort with a goal to go residential hospice. Husband remains hopeful for some improvement and patient can return home with caregiver support.  NO ARTIFICIAL FEEDINGS/PEG  Husband requests providers not discuss PEG or tubefeedings around caregivers or children,  as he states he is not sure his children would understand his rationale although he would hope they would feel the same way. Outpatient Palliative at request of husband  Diet: Dysphagia 1 diet DVT  Prophylaxis: Subcutaneous Lovenox   Advance goals of care discussion: DNR  Family Communication: family was present at bedside, at the time of interview.  Caregiver was present at the bedside, Opportunity was given to ask question and all questions were answered satisfactorily.   Disposition:  Discharge to residential hospice with palliative care.  Consultants: Palliative care Procedures: none  Antibiotics: Anti-infectives (From admission, onward)   Start     Dose/Rate Route Frequency Ordered Stop   03/07/19 2200  amoxicillin-clavulanate (AUGMENTIN) 400-57 MG/5ML suspension 800 mg  Status:  Discontinued     800 mg Oral Every 12 hours 03/07/19 1551 03/09/19 1425   03/04/19 2000  Ampicillin-Sulbactam (UNASYN) 3 g in sodium chloride 0.9 % 100 mL IVPB  Status:  Discontinued     3 g 200 mL/hr over 30 Minutes Intravenous Every 6 hours 03/04/19 1903 03/07/19 1551   03/04/19 1400  piperacillin-tazobactam (ZOSYN) IVPB 3.375 g     3.375 g 12.5 mL/hr over 240 Minutes Intravenous Once 03/04/19 1335 03/04/19 1452      Objective: Physical Exam: Vitals:   03/08/19 1638 03/09/19 0613 03/09/19 0821 03/09/19 1608  BP: 136/67 106/61 106/63 118/65  Pulse: 97 83 94 99  Resp: 18 16 16 15   Temp: 97.6 F (36.4 C) 98.5 F (36.9 C) 99.4 F (37.4 C) 99.1 F (37.3 C)  TempSrc: Oral Axillary Axillary Oral  SpO2: 96% 91% 94% 94%  Weight:      Height:        Intake/Output Summary (Last 24 hours) at 03/09/2019 1922 Last data filed at 03/09/2019 1200 Gross per 24 hour  Intake 3 ml  Output -  Net 3 ml   Filed Weights   03/04/19 1241 03/05/19 1743  Weight: 63.5 kg 63.5 kg   General: Lethargic, cachectic, appear in moderate distress, affect flat in affect Eyes: PERRL, Conjunctiva normal ENT: Oral Mucosa coated Neck: difficult to assess  JVD, no Abnormal Mass Or lumps Cardiovascular: S1 and S2 Present, no Murmur,  Respiratory: increased respiratory effort, Bilateral Air entry equal and Decreased,  no signs of accessory muscle use, bilateral upper airway crackles, no wheezes Abdomen: Bowel Sound present, Soft and no tenderness, no hernia Skin: no rashes  Extremities: no Pedal edema, no calf tenderness Neurologic: without any new focal findings Gait not checked due to patient safety concerns  Data Reviewed: I have personally reviewed and interpreted daily labs, tele strips, imagings as discussed above. I reviewed all nursing notes, pharmacy notes, vitals, pertinent old records I have discussed plan of care as described above with RN and patient/family.  CBC: Recent Labs  Lab 03/04/19 1252 03/05/19 0508  WBC 10.9* 13.1*  NEUTROABS 9.1* 11.0*  HGB 16.4* 13.6  HCT 52.3* 43.5  MCV 98.9 98.9  PLT 288 194   Basic Metabolic Panel: Recent Labs  Lab 03/05/19 0508  03/05/19 2250 03/06/19 0300 03/06/19 0926 03/07/19 0414 03/09/19 0526  NA 151*   < > 149* 145 145 144 140  K 3.0*   < > 3.1* 3.2* 3.8 3.5 3.5  CL 115*   < > 115* 112* 109 111 105  CO2 29   < > 27 28 27 26 26   GLUCOSE 126*   < > 113* 100* 97 94 115*  BUN 21   < >  13 13 11 12 19   CREATININE 0.80   < > 0.54 0.66 0.74 0.64 0.60  CALCIUM 8.2*   < > 8.3* 8.3* 8.6* 8.3* 8.7*  MG 2.3  --   --   --   --  2.3 2.4   < > = values in this interval not displayed.    Liver Function Tests: Recent Labs  Lab 03/04/19 1252  AST 37  ALT 35  ALKPHOS 84  BILITOT 0.8  PROT 9.0*  ALBUMIN 4.3   No results for input(s): LIPASE, AMYLASE in the last 168 hours. No results for input(s): AMMONIA in the last 168 hours. Coagulation Profile: No results for input(s): INR, PROTIME in the last 168 hours. Cardiac Enzymes: No results for input(s): CKTOTAL, CKMB, CKMBINDEX, TROPONINI in the last 168 hours. BNP (last 3 results) No results for input(s): PROBNP in the last 8760 hours. CBG: Recent Labs  Lab 03/04/19 2050  GLUCAP 99   Studies: No results found.   Time spent: 35 minutes  Author: Berle Mull, MD [facility]  03/09/2019 7:22 PM  To reach On-call, see care teams to locate the attending and reach out to them via [email or web address]. If 7PM-7AM, please contact night-coverage If you still have difficulty reaching the attending provider, please page the [facility] (Director on Call) for [facility] on amion for assistance.

## 2019-03-09 NOTE — TOC Progression Note (Signed)
Transition of Care [facility]) - Progression Note    Patient Details  Name: Ronia Hazelett MRN: [id number] Date of Birth: June 15, 1950  [facility]) CM/SW [place], RN Phone Number: 03/09/2019, 4:05 PM  Clinical Narrative:     Authoricare to take to [place] once bed available       Expected Discharge Plan and Services                                                 Social Determinants of Health (SDOH) Interventions    Readmission Risk Interventions No flowsheet data found.

## 2019-03-10 NOTE — Progress Notes (Signed)
Pt pulled out IV saline lock. pdowless,rn

## 2019-03-10 NOTE — Progress Notes (Signed)
No education done on Halperidol. Wrong pt documented on. pdowless,rn

## 2019-03-10 NOTE — Progress Notes (Signed)
[facility] Progress Note  Patient: Kelsey Mitchell AQT:[id number]   PCP: [facility] DOB: 15-Jan-1951   DOA: 03/04/2019   DOS: 03/10/2019   Date of Service: the patient was seen and examined on 03/10/2019  Chief Complaint  Patient presents with  . Kelsey Mitchell pneumonia   Brief hospital course: Theresia Mitchell is a 67 y.o. female with Past medical history of dementia, HTN, urinary incontinence, restless leg syndrome, HLD. Patient presented with complaints of cough and shortness of breath. Reportedly patient went to her beach house where she ate a bunch of deodorant gel balls.  She had some foaming from the mouth at that time and had some cough. She went to ED that day in 3 hours and with normal chest x-ray she was sent home. She went to ED again 2 days later due to persistent cough at which time they identified a possible pneumonia on the chest x-ray and patient was started on Augmentin. Patient continues to remain weak and fatigued and tired.  Has poor p.o. intake.  No nausea or vomiting.  Patient was also more confused than her baseline and therefore husband called the PCP and patient was informed that she should come to the ER for further evaluation. At the time of my evaluation patient denies any complaints of chest pain abdominal pain.  No nausea no vomiting. Patient does have difficulty swallowing at her baseline.  Currently further plan is continue comfort care.  Subjective: No acute event.  Continues to have shortness of breath.  No nausea no vomiting.  Assessment and Plan: Scheduled Meds: . guaiFENesin  5 mL Oral QID  . mirabegron ER  50 mg Oral Daily  . rOPINIRole  3 mg Oral QHS  . sodium chloride flush  3 mL Intravenous Q12H  . sodium chloride flush  3 mL Intravenous Q12H  . traZODone  50 mg Oral QHS   Continuous Infusions: . sodium chloride Stopped (03/07/19 1155)  . sodium chloride     PRN Meds: sodium chloride, sodium chloride, acetaminophen **OR**  acetaminophen, antiseptic oral rinse, glycopyrrolate **OR** glycopyrrolate **OR** glycopyrrolate, haloperidol **OR** haloperidol **OR** haloperidol lactate, LORazepam **OR** LORazepam **OR** LORazepam, morphine injection, morphine CONCENTRATE, ondansetron **OR** ondansetron (ZOFRAN) IV, polyvinyl alcohol, sodium chloride flush, sodium chloride flush  1. Aspiration pneumonia Speech therapy consulted. Appreciate assistance. Currently on dysphagia 1 diet. Treated with IV Unasyn. Change to Augmentin. No growth on cultures. Chest vest as the patient appears to have poor cough. Today patient continues to have poor p.o. intake as well as aspiration precaution Discussed with husband who is at bedside and the goal is to transition her to complete comfort. May even require residential hospice. Monitor care.  2. Dementia. Restless leg syndrome. Continue home medication for now. Palliative care consult for goals of care discussion.   Appreciate assistance.  3. Hypernatremia. Resolved  From poor p.o. intake. Patient treated with  D5 half-normal saline and then d5 only Now resolved and we stopped the fluids.   4. Urinary incontinence. Patient is on Myrbetriq. We will resume  5. Severe Dysphagia. Speech therapy consulted. MRI brain negative for any acute stroke. Do not think that deodorizer and beads are actually causing patient's dysphagia.  6. Goals of care discussion: DNR/DNI-as confirmed by husband. Transition to complete comfort with a goal to go residential hospice. Husband remains hopeful for some improvement and patient can return home with caregiver support.  NO ARTIFICIAL FEEDINGS/PEG  Husband requests providers not discuss PEG or tubefeedings around caregivers or children, as  he states he is not sure his children would understand his rationale although he would hope they would feel the same way. Outpatient Palliative at request of husband  Diet: Dysphagia 1 diet DVT  Prophylaxis: Subcutaneous Lovenox   Advance goals of care discussion: DNR  Family Communication: family was present at bedside, at the time of interview.  Opportunity was given to ask question and all questions were answered satisfactorily.   Disposition:  Discharge to residential hospice with palliative care.  Tomorrow  Consultants: Palliative care Procedures: none  Antibiotics: Anti-infectives (From admission, onward)   Start     Dose/Rate Route Frequency Ordered Stop   03/07/19 2200  amoxicillin-clavulanate (AUGMENTIN) 400-57 MG/5ML suspension 800 mg  Status:  Discontinued     800 mg Oral Every 12 hours 03/07/19 1551 03/09/19 1425   03/04/19 2000  Ampicillin-Sulbactam (UNASYN) 3 g in sodium chloride 0.9 % 100 mL IVPB  Status:  Discontinued     3 g 200 mL/hr over 30 Minutes Intravenous Every 6 hours 03/04/19 1903 03/07/19 1551   03/04/19 1400  piperacillin-tazobactam (ZOSYN) IVPB 3.375 g     3.375 g 12.5 mL/hr over 240 Minutes Intravenous Once 03/04/19 1335 03/04/19 1452      Objective: Physical Exam: Vitals:   03/09/19 1608 03/10/19 0023 03/10/19 0757 03/10/19 1303  BP: 118/65 113/60 109/71   Pulse: 99 (!) 105 95   Resp: 15 18 16    Temp: 99.1 F (37.3 C) 99.4 F (37.4 C) 98.9 F (37.2 C)   TempSrc: Oral Axillary Axillary   SpO2: 94% (!) 88% 96% 92%  Weight:      Height:        Intake/Output Summary (Last 24 hours) at 03/10/2019 1840 Last data filed at 03/10/2019 1805 Gross per 24 hour  Intake -  Output 1200 ml  Net -1200 ml   Filed Weights   03/04/19 1241 03/05/19 1743  Weight: 63.5 kg 63.5 kg   General: Lethargic, cachectic, appear in moderate distress, affect flat in affect Eyes: PERRL, Conjunctiva normal ENT: Oral Mucosa coated Neck: difficult to assess  JVD, no Abnormal Mass Or lumps Cardiovascular: S1 and S2 Present, no Murmur,  Respiratory: increased respiratory effort, Bilateral Air entry equal and Decreased, no signs of accessory muscle use,  bilateral upper airway crackles, no wheezes Abdomen: Bowel Sound present, Soft and no tenderness, no hernia Skin: no rashes  Extremities: no Pedal edema, no calf tenderness Neurologic: without any new focal findings Gait not checked due to patient safety concerns  Data Reviewed: I have personally reviewed and interpreted daily labs, tele strips, imagings as discussed above. I reviewed all nursing notes, pharmacy notes, vitals, pertinent old records I have discussed plan of care as described above with RN and patient/family.  CBC: Recent Labs  Lab 03/04/19 1252 03/05/19 0508  WBC 10.9* 13.1*  NEUTROABS 9.1* 11.0*  HGB 16.4* 13.6  HCT 52.3* 43.5  MCV 98.9 98.9  PLT 288 194   Basic Metabolic Panel: Recent Labs  Lab 03/05/19 0508  03/05/19 2250 03/06/19 0300 03/06/19 0926 03/07/19 0414 03/09/19 0526  NA 151*   < > 149* 145 145 144 140  K 3.0*   < > 3.1* 3.2* 3.8 3.5 3.5  CL 115*   < > 115* 112* 109 111 105  CO2 29   < > 27 28 27 26 26   GLUCOSE 126*   < > 113* 100* 97 94 115*  BUN 21   < > 13 13 11 12  19  CREATININE 0.80   < > 0.54 0.66 0.74 0.64 0.60  CALCIUM 8.2*   < > 8.3* 8.3* 8.6* 8.3* 8.7*  MG 2.3  --   --   --   --  2.3 2.4   < > = values in this interval not displayed.    Liver Function Tests: Recent Labs  Lab 03/04/19 1252  AST 37  ALT 35  ALKPHOS 84  BILITOT 0.8  PROT 9.0*  ALBUMIN 4.3   No results for input(s): LIPASE, AMYLASE in the last 168 hours. No results for input(s): AMMONIA in the last 168 hours. Coagulation Profile: No results for input(s): INR, PROTIME in the last 168 hours. Cardiac Enzymes: No results for input(s): CKTOTAL, CKMB, CKMBINDEX, TROPONINI in the last 168 hours. BNP (last 3 results) No results for input(s): PROBNP in the last 8760 hours. CBG: Recent Labs  Lab 03/04/19 2050  GLUCAP 99   Studies: No results found.   Time spent: 35 minutes  Author: Berle Mull, MD [facility] 03/10/2019 6:40 PM  To reach  On-call, see care teams to locate the attending and reach out to them via [email or web address]. If 7PM-7AM, please contact night-coverage If you still have difficulty reaching the attending provider, please page the [facility] (Director on Call) for [facility] on amion for assistance.

## 2019-03-10 NOTE — Discharge Summary (Addendum)
[facility] Discharge Summary   Patient: Kelsey Mitchell KGM:[id number]   PCP: [facility] DOB: 12-10-1950   Date of admission: 03/04/2019   Date of discharge:  03/12/2019    Discharge Diagnoses:  Active Problems:   Aspiration pneumonia [facility])  Admitted From: Home Disposition:  Residential hospice   Recommendations for Outpatient Follow-up:  1. PCP: Establish care with residential hospice 2. Follow up LABS/TEST:  none   Diet recommendation: NPO due to frequent aspiration, may have comfort feeds  Activity: The patient is advised to gradually reintroduce usual activities,as tolerated  Discharge Condition: fair  Code Status: DNR   History of present illness: As per the H and P dictated on admission, "Kelsey Mitchell is a 68 y.o. female with Past medical history of dementia, HTN, urinary incontinence, restless leg syndrome, HLD. Patient presented with complaints of cough and shortness of breath. Reportedly patient went to her beach house where she ate a bunch of deodorant gel balls.  She had some foaming from the mouth at that time and had some cough. She went to ED that day in 3 hours and with normal chest x-ray she was sent home. She went to ED again 2 days later due to persistent cough at which time they identified a possible pneumonia on the chest x-ray and patient was started on Augmentin. Patient continues to remain weak and fatigued and tired.  Has poor p.o. intake.  No nausea or vomiting.  Patient was also more confused than her baseline and therefore husband called the PCP and patient was informed that she should come to the ER for further evaluation. At the time of my evaluation patient denies any complaints of chest pain abdominal pain.  No nausea no vomiting. Patient does have difficulty swallowing at her baseline."  Hospital Course:  Summary of her active problems in the hospital is as following. 1.Aspiration pneumonia Speech therapy consulted.  Appreciate assistance. Currently on dysphagia 1 diet. Treated with IV Unasyn. Change to Augmentin. No growth on cultures. Chest vest was also provided as the patient appears to have poor cough. Discussed with husband who is at bedside and the goal is to transition her to complete comfort.  Husband agreed. Currently the plan is to transition patient to residential hospice on 03/11/2019.  2.Dementia. Restless leg syndrome. Continue home medication for now. Palliative care consult for goals of care discussion.   Appreciate assistance.  3.Hypernatremia. Resolved  From poor p.o. intake. Patient treated with  D5 half-normal saline and then d5 only resolved and we stopped the fluids.   4.Urinary incontinence. Patient is on Myrbetriq. Monitor  5. Severe Dysphagia. Speech therapy consulted. MRI brain negative for any acute stroke. Do not think that deodorizer and beads are actually causing patient's dysphagia.  6. Goals of care discussion: DNR/DNI-as confirmed by husband. Transition to complete comfort with a goal to go residential hospice. Husband remains hopeful for some improvement and patient can return home with caregiver support.  NO ARTIFICIAL FEEDINGS/PEG Transitioning transition to residential hospice.  On the day of the discharge the patient's vitals were stable, and no other acute medical condition were reported by patient. the patient was felt safe to be discharge at Residential hospice with no therapy needed on discharge  Consultants: Palliative care  Procedures: none  DISCHARGE MEDICATION: Allergies as of 03/10/2019   No Known Allergies     Medication List    STOP taking these medications   amoxicillin-clavulanate 875-125 MG tablet Commonly known as: AUGMENTIN   Klor-Con  M20 20 MEQ tablet Generic drug: potassium chloride SA   lisinopril 10 MG tablet Commonly known as: ZESTRIL   Myrbetriq 50 MG Tb24 tablet Generic drug: mirabegron ER   rOPINIRole  3 MG tablet Commonly known as: REQUIP   simvastatin 20 MG tablet Commonly known as: ZOCOR   traZODone 50 MG tablet Commonly known as: DESYREL      No Known Allergies Discharge Instructions    Increase activity slowly   Complete by: As directed      The results of significant diagnostics from this hospitalization (including imaging, microbiology, ancillary and laboratory) are listed below for reference.    Significant Diagnostic Studies: Dg Chest 2 View  Result Date: 03/04/2019 CLINICAL DATA:  Cough and shortness of breath. EXAM: CHEST - 2 VIEW COMPARISON:  Radiograph 08/18/2012 FINDINGS: Patchy left lower lobe opacity suspicious for pneumonia. Streaky right infrahilar atelectasis. Overall lung volumes are low. Normal heart size and mediastinal contours. No pulmonary edema, pleural effusion, or pneumothorax. Colonic interposition under the left hemidiaphragm. Exaggerated thoracic kyphosis. Mild anterior wedging of vertebra at the thoracolumbar junction. IMPRESSION: 1. Patchy left lower lobe opacity suspicious for pneumonia. 2. Streaky right infrahilar atelectasis. 3. Anterior wedging of vertebra at the thoracolumbar junction, mild age indeterminate compression fracture, but new from 2014. Electronically Signed   By: Narda RutherfordMelanie  Sanford M.D.   On: 03/04/2019 13:37   Mr Brain Wo Contrast  Result Date: 03/05/2019 CLINICAL DATA:  Altered level of consciousness. EXAM: MRI HEAD WITHOUT CONTRAST TECHNIQUE: Multiplanar, multiecho pulse sequences of the brain and surrounding structures were obtained without intravenous contrast. COMPARISON:  None FINDINGS: Brain: Moderate to advanced atrophy. Advanced atrophy in the medial temporal lobe and hippocampus bilaterally. Negative for acute infarct. No significant chronic ischemia. Negative for hemorrhage or mass. Rapid scanning performed due to motion. Vascular: Normal arterial flow voids. Skull and upper cervical spine: Negative Sinuses/Orbits: Negative  Other: None IMPRESSION: Moderate to advanced atrophy.  No acute abnormality. Electronically Signed   By: Marlan Palauharles  Maltos M.D.   On: 03/05/2019 18:50    Microbiology: Recent Results (from the past 240 hour(s))  Blood culture (routine x 2)     Status: None   Collection Time: 03/04/19 12:52 PM   Specimen: BLOOD  Result Value Ref Range Status   Specimen Description BLOOD RIGHT ANTECUBITAL  Final   Special Requests   Final    BOTTLES DRAWN AEROBIC AND ANAEROBIC Blood Culture adequate volume   Culture   Final    NO GROWTH 5 DAYS Performed at [facility], [address] [place], [place] [address]    Report Status 03/09/2019 FINAL  Final  MRSA PCR Screening     Status: None   Collection Time: 03/04/19  1:48 PM   Specimen: Nasopharyngeal  Result Value Ref Range Status   MRSA by PCR NEGATIVE NEGATIVE Final    Comment:        The GeneXpert MRSA Assay (FDA approved for NASAL specimens only), is one component of a comprehensive MRSA colonization surveillance program. It is not intended to diagnose MRSA infection nor to guide or monitor treatment for MRSA infections. Performed at [facility], [address] [place], [place] [address]   Blood culture (routine x 2)     Status: None   Collection Time: 03/04/19  2:52 PM   Specimen: BLOOD  Result Value Ref Range Status   Specimen Description BLOOD LEFT ANTECUBITAL  Final   Special Requests   Final    BOTTLES DRAWN AEROBIC  AND ANAEROBIC Blood Culture adequate volume   Culture   Final    NO GROWTH 5 DAYS Performed at [facility], [address] [place], [place] [address]    Report Status 03/09/2019 FINAL  Final  SARS CORONAVIRUS 2 (TAT 6-24 HRS) Nasopharyngeal Nasopharyngeal Swab     Status: None   Collection Time: 03/05/19  1:12 AM   Specimen: Nasopharyngeal Swab  Result Value Ref Range Status   SARS Coronavirus 2 NEGATIVE NEGATIVE Final    Comment: (NOTE) SARS-CoV-2 target nucleic acids are NOT  DETECTED. The SARS-CoV-2 RNA is generally detectable in upper and lower respiratory specimens during the acute phase of infection. Negative results do not preclude SARS-CoV-2 infection, do not rule out co-infections with other pathogens, and should not be used as the sole basis for treatment or other patient management decisions. Negative results must be combined with clinical observations, patient history, and epidemiological information. The expected result is Negative. Fact Sheet for Patients: HairSlick.no Fact Sheet for Healthcare Providers: quierodirigir.com This test is not yet approved or cleared by the [place] FDA and  has been authorized for detection and/or diagnosis of SARS-CoV-2 by FDA under an Emergency Use Authorization (EUA). This EUA will remain  in effect (meaning this test can be used) for the duration of the COVID-19 declaration under Section 56 4(b)(1) of the Act, 21 U.S.C. section 360bbb-3(b)(1), unless the authorization is terminated or revoked sooner. Performed at [facility], [address] [place], [place] [address]      Labs: CBC: Recent Labs  Lab 03/04/19 1252 03/05/19 0508  WBC 10.9* 13.1*  NEUTROABS 9.1* 11.0*  HGB 16.4* 13.6  HCT 52.3* 43.5  MCV 98.9 98.9  PLT 288 194   Basic Metabolic Panel: Recent Labs  Lab 03/05/19 0508  03/05/19 2250 03/06/19 0300 03/06/19 0926 03/07/19 0414 03/09/19 0526  NA 151*   < > 149* 145 145 144 140  K 3.0*   < > 3.1* 3.2* 3.8 3.5 3.5  CL 115*   < > 115* 112* 109 111 105  CO2 29   < > 27 28 27 26 26   GLUCOSE 126*   < > 113* 100* 97 94 115*  BUN 21   < > 13 13 11 12 19   CREATININE 0.80   < > 0.54 0.66 0.74 0.64 0.60  CALCIUM 8.2*   < > 8.3* 8.3* 8.6* 8.3* 8.7*  MG 2.3  --   --   --   --  2.3 2.4   < > = values in this interval not displayed.   Liver Function Tests: Recent Labs  Lab 03/04/19 1252  AST 37  ALT 35  ALKPHOS 84   BILITOT 0.8  PROT 9.0*  ALBUMIN 4.3   No results for input(s): LIPASE, AMYLASE in the last 168 hours. No results for input(s): AMMONIA in the last 168 hours. Cardiac Enzymes: No results for input(s): CKTOTAL, CKMB, CKMBINDEX, TROPONINI in the last 168 hours. BNP (last 3 results) No results for input(s): BNP in the last 8760 hours. CBG: Recent Labs  Lab 03/04/19 2050  GLUCAP 99    Time spent: 35 minutes  Signed:  14/02/20  [facility]  03/10/2019 6:41 PM     Patient seen and examined.  Husband at bedside.  No issues per husband.  Patient laying in bed does not respond to cooperate with exam.  She is ready to be discharged to the hospice home facility. vss  nad  cta  no r/r/w Regular s1/s2 Soft benign No edema  A/P:  Will d/c to hospice home facility today.  Time spent :35 min

## 2019-03-10 NOTE — Progress Notes (Addendum)
Follow up visit made to new referral for [facility] hosp[facility] home. Patient found alert with food in her mouth, addressed with staff RN, aide and MD. Patient is not swallowing anything that is put in her mouth.  Visit later in the morning when patient's husband was present. Emotional support given. Patient noted with increased work of breathing, MD present and PRN liquid morphine to be given, staff RN Debi made aware. Education given to Mr. Schnee regarding use of liquid morphine with understanding voiced.  Plan is for transfer to the hospice home tomorrow via EMS. Hospital care team and family aware. Will continue to follow through discharge. Flo Shanks [facility], [place] [employer] [phone]

## 2019-03-10 NOTE — Progress Notes (Signed)
Unable to do education with pt due to dementia. pdowless,rn

## 2019-03-11 NOTE — TOC Transition Note (Signed)
Transition of Care [facility]) - CM/SW Discharge Note   Patient Details  Name: Kelsey Mitchell MRN: [id number] Date of Birth: March 01, 1951  Transition of Care [facility]) CM/SW Contact:  Victorino Dike, RN Phone Number: 03/11/2019, 8:52 AM   Clinical Narrative:    Patient will discharge to [facility] today via EMS  Santiago Glad with [facility] will notify family, will call report and notify EMS for transport.  Final next level of care: [place] Barriers to Discharge: Barriers Resolved         Discharge Placement                       Discharge Plan and Services                                     Social Determinants of Health (SDOH) Interventions     Readmission Risk Interventions No flowsheet data found.

## 2019-03-11 NOTE — Progress Notes (Signed)
Follow up visit made to new referral for [facility] hospice home. Patient seen lying in bed, alert, husband Mortimer Fries at bedside. Mortimer Fries remains in agreement for Mrs. Crill to discharge to the hospice home today. Report called to the hospice home, EMS notified for transport. Hospital care team and family updated. Flo Shanks BSN, RN, [place] [phone]

## 2019-03-11 NOTE — Progress Notes (Signed)
Nutrition Brief Follow-Up Note  Chart reviewed. Patient has transitioned to comfort care.   No further nutrition interventions warranted at this time. Please consult RD as needed.   Yasmina [place] King, MS, RD, LDN Office: [phone] Pager: [phone] After Hours/Weekend Pager: [phone]   

## 2019-03-11 NOTE — Progress Notes (Signed)
Patient is going to [facility]. IV still intact. Vitals stable. EMS came to get patient for transport.

## 2019-03-11 NOTE — Progress Notes (Signed)
Daily Progress Note   Patient Name: Kelsey Mitchell       Date: 03/11/2019 DOB: 1950/10/12  Age: 68 y.o. MRN#: [id number] Attending Physician: Nolberto Hanlon, MD Primary Care Physician: Angelene Giovanni Primary Care Admit Date: 03/04/2019  Reason for Consultation/Follow-up: Establishing goals of care  Subjective: Patient is resting in bed. No distress noted at this time. She is pending hospice facility placement. Plans for transfer today pending paperwork.    Length of Stay: 7  Current Medications: Scheduled Meds:  . guaiFENesin  5 mL Oral QID  . mirabegron ER  50 mg Oral Daily  . rOPINIRole  3 mg Oral QHS  . sodium chloride flush  3 mL Intravenous Q12H  . sodium chloride flush  3 mL Intravenous Q12H  . traZODone  50 mg Oral QHS    Continuous Infusions: . sodium chloride Stopped (03/07/19 1155)  . sodium chloride      PRN Meds: sodium chloride, sodium chloride, acetaminophen **OR** acetaminophen, antiseptic oral rinse, glycopyrrolate **OR** glycopyrrolate **OR** glycopyrrolate, haloperidol **OR** haloperidol **OR** haloperidol lactate, LORazepam **OR** LORazepam **OR** LORazepam, morphine injection, morphine CONCENTRATE, ondansetron **OR** ondansetron (ZOFRAN) IV, polyvinyl alcohol, sodium chloride flush, sodium chloride flush  Physical Exam Constitutional:      Comments: Opens eyes to touch.   Skin:    General: Skin is warm and dry.  Neurological:     Comments: Does not speak.              Vital Signs: BP 114/67 (BP Location: Left Arm)   Pulse 86   Temp 98.6 F (37 C) (Axillary)   Resp 20   Ht 5\' 3"  (1.6 m)   Wt 63.5 kg   SpO2 90%   BMI 24.80 kg/m  SpO2: SpO2: 90 % O2 Device: O2 Device: Nasal Cannula O2 Flow Rate: O2 Flow Rate (L/min): 4 L/min  Intake/output  summary:   Intake/Output Summary (Last 24 hours) at 03/11/2019 1046 Last data filed at 03/10/2019 1805 Gross per 24 hour  Intake -  Output 200 ml  Net -200 ml   LBM: Last BM Date: (unknown) Baseline Weight: Weight: 63.5 kg Most recent weight: Weight: 63.5 kg       Palliative Assessment/Data: 10%      Patient Active Problem List   Diagnosis Date Noted  . Aspiration pneumonia ([place]) 03/04/2019  Palliative Care Assessment & Plan    Recommendations/Plan: Pending hospice facility placement.  Code Status:    Code Status Orders  (From admission, onward)         Start     Ordered   03/04/19 1835  Do not attempt resuscitation (DNR)  Continuous    Question Answer Comment  In the event of cardiac or respiratory ARREST Do not call a "code blue"   In the event of cardiac or respiratory ARREST Do not perform Intubation, CPR, defibrillation or ACLS   In the event of cardiac or respiratory ARREST Use medication by any route, position, wound care, and other measures to relive pain and suffering. May use oxygen, suction and manual treatment of airway obstruction as needed for comfort.      03/04/19 1838        Code Status History    This patient has a current code status but no historical code status.   Advance Care Planning Activity    Advance Directive Documentation     Most Recent Value  Type of Advance Directive  Living will, Healthcare Power of Attorney  Pre-existing out of facility DNR order (yellow form or pink MOST form)  -  "MOST" Form in Place?  -       Prognosis:   < 2 weeks Bites and sips at most. Advanced dementia.     Care plan was discussed with primary MD and order placed.   Thank you for allowing the Palliative Medicine Team to assist in the care of this patient.   Total Time 15 min  Prolonged Time Billed no      Greater than 50%  of this time was spent counseling and coordinating care related to the above assessment and plan.  Morton Stall, NP  Please contact Palliative Medicine Team phone at [phone] for questions and concerns.

## 2019-04-03 DEATH — deceased

## 2021-02-05 IMAGING — MR MR HEAD W/O CM
9 of 10 series · 43 of 48 positions shown · non-contrast
Comparison: None

CLINICAL DATA: Altered level of consciousness.

EXAM:
MRI HEAD WITHOUT CONTRAST
TECHNIQUE: Multiplanar, multiecho pulse sequences of the brain and surrounding
structures were obtained without intravenous contrast.

[Series 2: ax dwi_tracew · axial · 3.0mm · 0.83mm/px · z∈[-28,+130]mm · 7 of 56 slices shown]
[im 1/56]
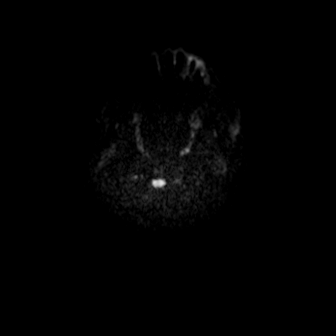
[im 10/56]
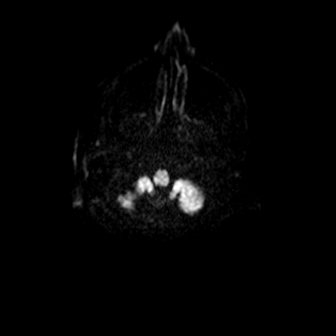
[im 19/56]
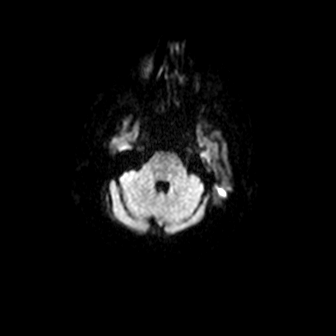
[im 28/56]
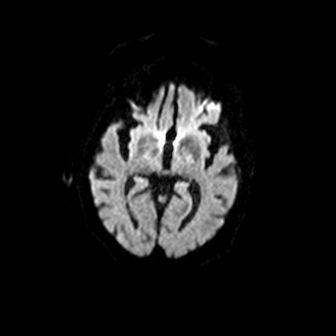
[im 37/56]
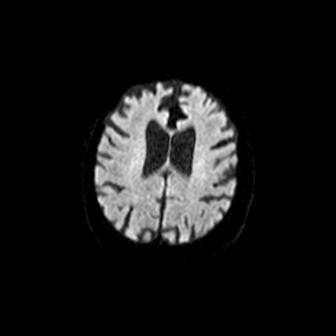
[im 46/56]
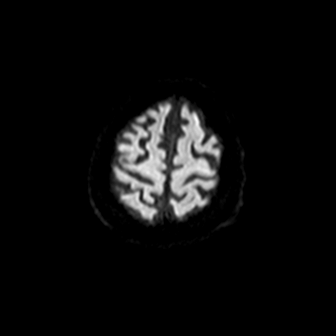
[im 56/56]
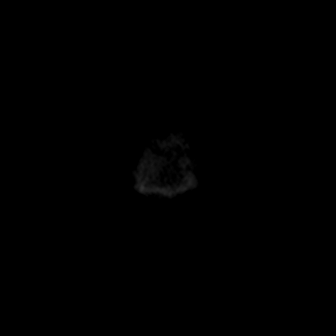

[Series 3: ax dwi_adc · axial · 3.0mm · 0.83mm/px · z∈[-28,+130]mm · 8 of 56 slices shown]
[im 1/56]
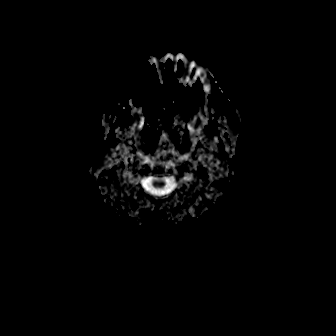
[im 8/56]
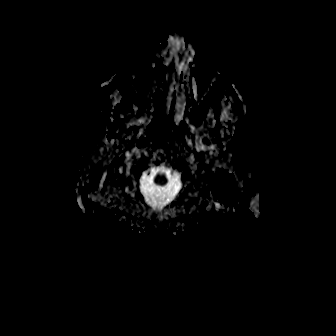
[im 16/56]
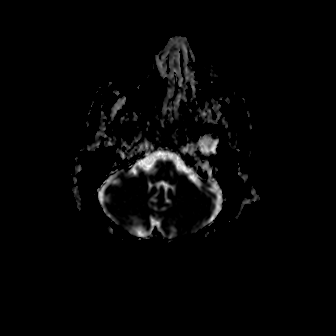
[im 24/56]
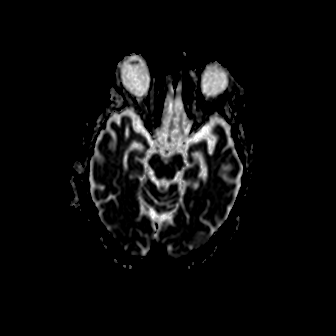
[im 32/56]
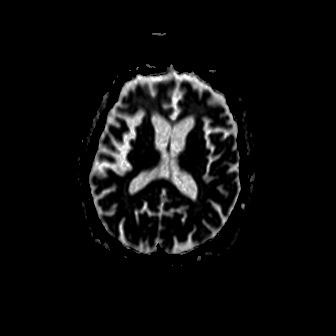
[im 40/56]
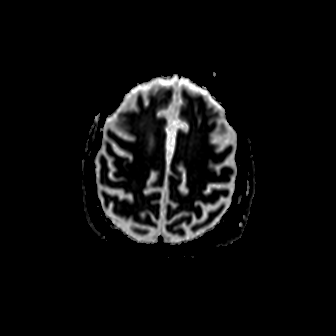
[im 48/56]
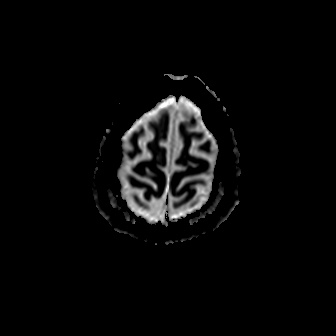
[im 56/56]
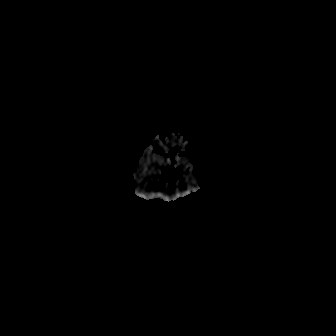

[Series 4: cor dwi_tracew · coronal · 5.0mm · 0.68mm/px · 5 of 36 slices shown]
[im 1/36]
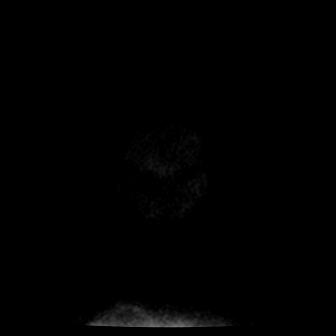
[im 9/36]
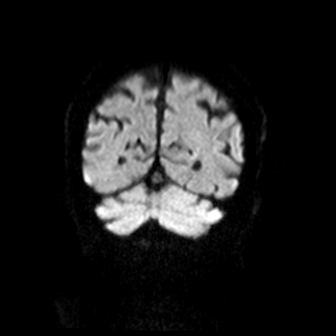
[im 18/36]
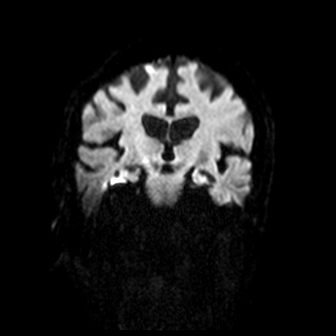
[im 27/36]
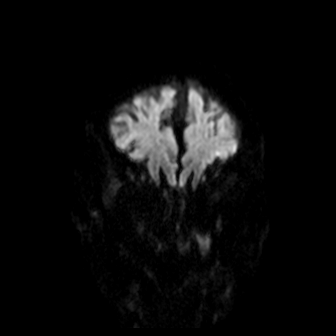
[im 36/36]
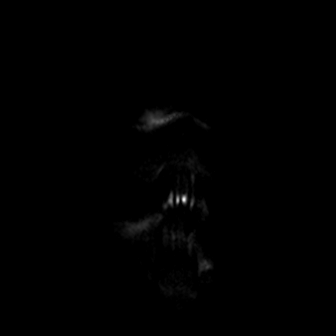

[Series 5: cor dwi_adc · coronal · 5.0mm · 0.68mm/px · 4 of 36 slices shown]
[im 1/36]
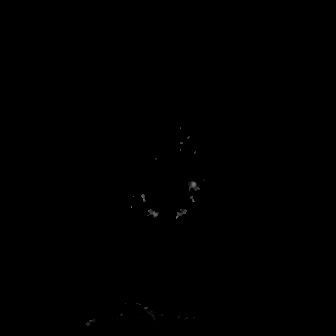
[im 9/36]
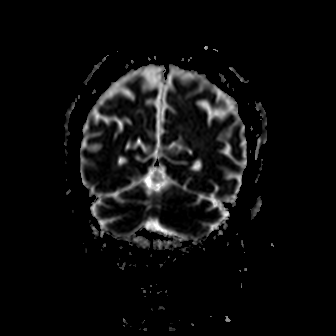
[im 18/36]
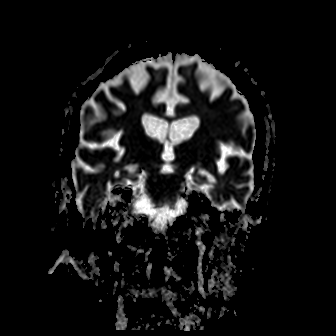
[im 27/36]
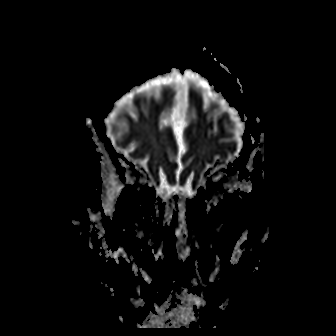

[Series 6: T1 · sagittal · 5.0mm · 0.94mm/px · 3 of 23 slices shown (1 of 2)]
[im 1/23]
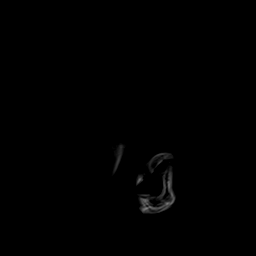
[im 12/23]
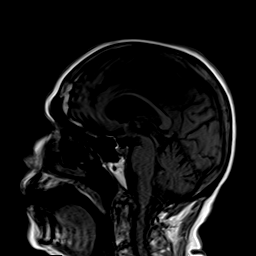
[im 23/23]
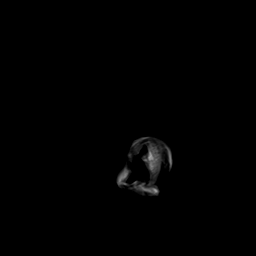

[Series 7: T2 · axial · 5.0mm · 0.45mm/px · z∈[-31,+119]mm · 4 of 27 slices shown (1 of 2)]
[im 1/27]
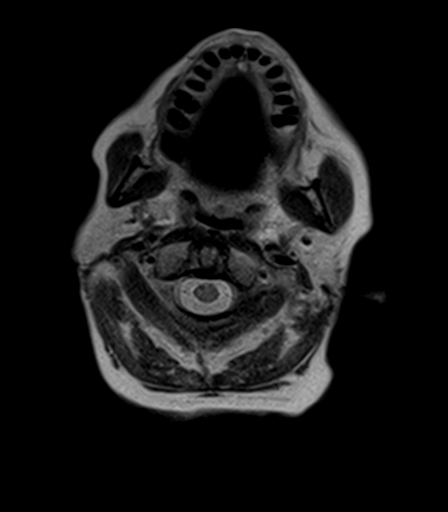
[im 9/27]
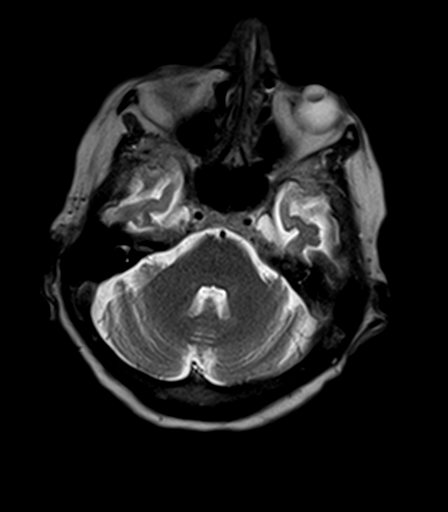
[im 18/27]
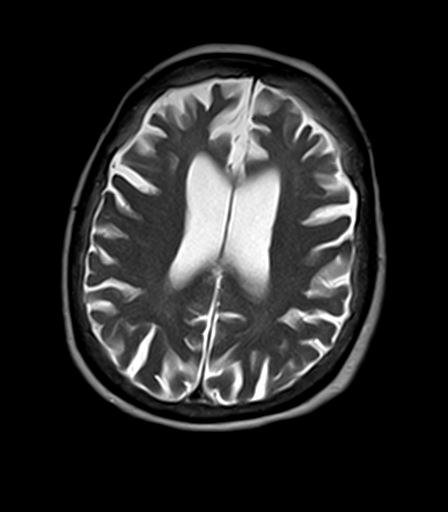
[im 27/27]
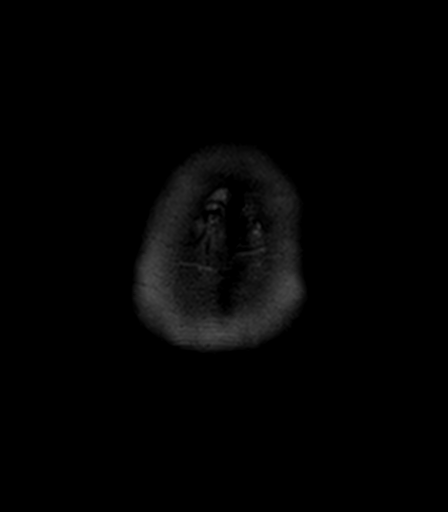

[Series 9: T1 · axial · 5.0mm · 0.90mm/px · z∈[-31,+119]mm · 4 of 27 slices shown (2 of 2)]
[im 1/27]
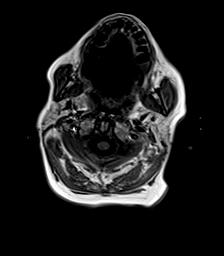
[im 9/27]
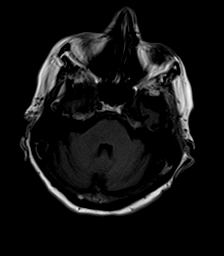
[im 18/27]
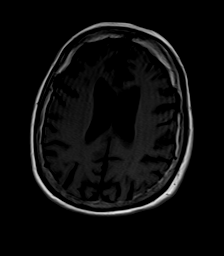
[im 27/27]
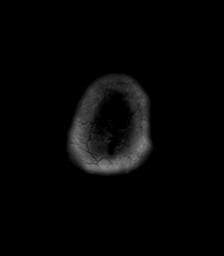

[Series 10: FLAIR · axial · 5.0mm · 1.20mm/px · z∈[-31,+119]mm · 4 of 27 slices shown]
[im 1/27]
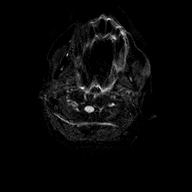
[im 9/27]
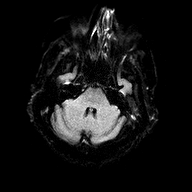
[im 18/27]
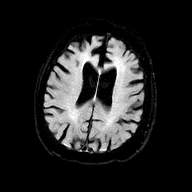
[im 27/27]
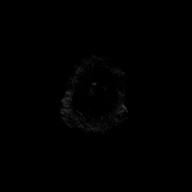

[Series 11: T2 · coronal · 5.0mm · 0.45mm/px · 4 of 31 slices shown (2 of 2)]
[im 1/31]
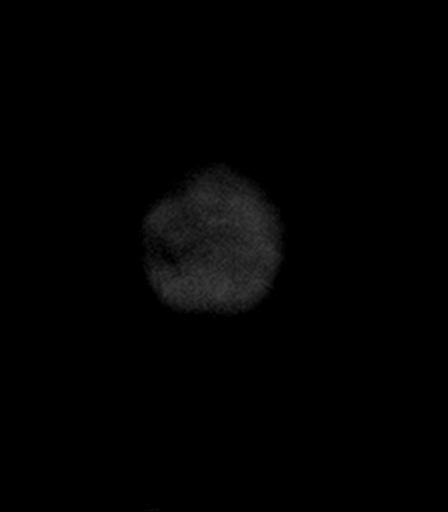
[im 11/31]
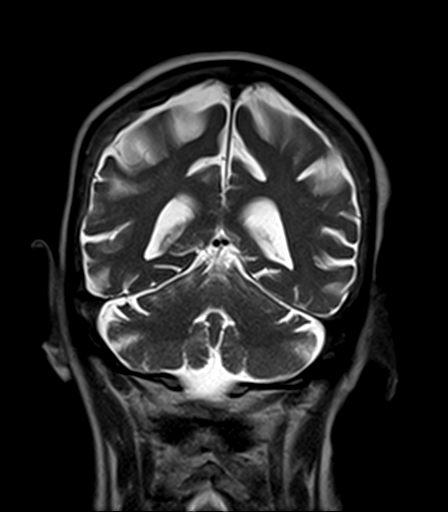
[im 21/31]
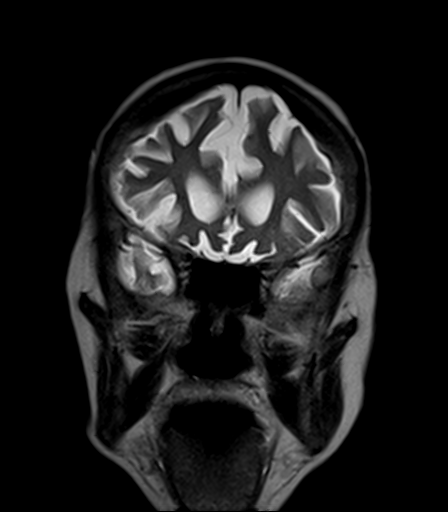
[im 31/31]
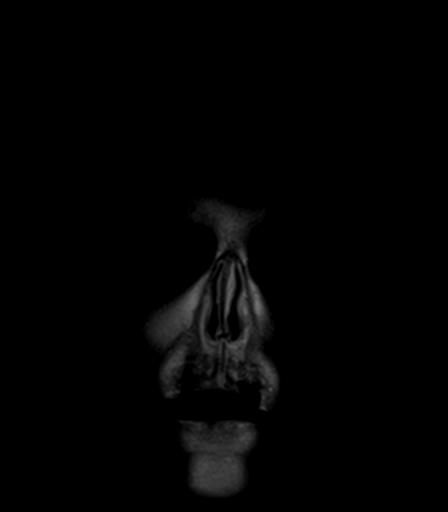

[43 of 48 positions shown; findings below may reference images not displayed]

FINDINGS: Brain: Moderate to advanced atrophy. Advanced atrophy in the medial
temporal lobe and hippocampus bilaterally.

Negative for acute infarct. No significant chronic ischemia.
Negative for hemorrhage or mass.

Rapid scanning performed due to motion.

Vascular: Normal arterial flow voids.

Skull and upper cervical spine: Negative

Sinuses/Orbits: Negative

Other: None
IMPRESSION: Moderate to advanced atrophy.  No acute abnormality.
